# Patient Record
Sex: Female | Born: 1995 | Race: White | Hispanic: No | Marital: Married | Smoking: Never smoker
Health system: Southern US, Community
[De-identification: ages and names within clinical notes are randomized; demographics above are authoritative.]

## PROBLEM LIST (undated history)

## (undated) ENCOUNTER — Inpatient Hospital Stay (HOSPITAL_COMMUNITY): Payer: Self-pay

## (undated) DIAGNOSIS — E282 Polycystic ovarian syndrome: Secondary | ICD-10-CM

## (undated) DIAGNOSIS — F419 Anxiety disorder, unspecified: Secondary | ICD-10-CM

## (undated) DIAGNOSIS — N946 Dysmenorrhea, unspecified: Principal | ICD-10-CM

## (undated) DIAGNOSIS — Z789 Other specified health status: Secondary | ICD-10-CM

## (undated) DIAGNOSIS — O24419 Gestational diabetes mellitus in pregnancy, unspecified control: Secondary | ICD-10-CM

## (undated) HISTORY — DX: Anxiety disorder, unspecified: F41.9

## (undated) HISTORY — PX: WISDOM TOOTH EXTRACTION: SHX21

## (undated) HISTORY — DX: Polycystic ovarian syndrome: E28.2

## (undated) HISTORY — DX: Gestational diabetes mellitus in pregnancy, unspecified control: O24.419

## (undated) HISTORY — DX: Dysmenorrhea, unspecified: N94.6

---
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
## 2015-01-20 ENCOUNTER — Telehealth: Payer: Self-pay | Admitting: Obstetrics & Gynecology

## 2015-01-20 NOTE — Telephone Encounter (Signed)
Pt states she is taking her birth control pill as Dr. Elonda Husky prescribed but did miss taking 1 daily dose. Pt states she is at the point in the pack she should be having her period but has not started yet. Per Derrek Monaco, NP do home pregnancy test and continue birth control. Pt verbalized understanding.

## 2015-01-28 ENCOUNTER — Ambulatory Visit (INDEPENDENT_AMBULATORY_CARE_PROVIDER_SITE_OTHER): Payer: Medicaid Other | Admitting: Family Medicine

## 2015-01-28 ENCOUNTER — Encounter: Payer: Self-pay | Admitting: Family Medicine

## 2015-01-28 VITALS — BP 110/70 | Temp 98.5°F | Ht 66.0 in | Wt 179.5 lb

## 2015-01-28 DIAGNOSIS — J329 Chronic sinusitis, unspecified: Secondary | ICD-10-CM

## 2015-01-28 MED ORDER — AZITHROMYCIN 250 MG PO TABS: ORAL_TABLET | ORAL | Status: DC

## 2015-01-28 NOTE — Progress Notes (Signed)
   Subjective:    Patient ID: Paula Houston, female    DOB: 04-20-1996, 19 y.o.   MRN: [id number]  Sinusitis This is a new problem. The current episode started in the past 7 days. The problem is unchanged. There has been no fever. The pain is moderate. Associated symptoms include congestion, coughing, sneezing and a sore throat. (Nausea) Past treatments include oral decongestants. The treatment provided no relief.   Sore thraot and head ache  Sig headache  Mild achiness in legs  Throat mildly sore  Felt sick and vomited, also sig fdirrhea   Review of Systems  HENT: Positive for congestion, sneezing and sore throat.   Respiratory: Positive for cough.        Objective:   Physical Exam  Alert mild malaise HET moderate his congestion frontal tenderness. Erythematous neck supple lungs clear heart regular rate and rhythm      Assessment & Plan:  Impression acute rhinosinusitis plan antibiotics prescribed. Symptom care discussed warning signs discussed WSL

## 2015-02-03 ENCOUNTER — Telehealth: Payer: Self-pay | Admitting: Family Medicine

## 2015-02-03 ENCOUNTER — Other Ambulatory Visit: Payer: Self-pay | Admitting: *Deleted

## 2015-02-03 MED ORDER — BENZONATATE 100 MG PO CAPS: ORAL_CAPSULE | ORAL | Status: DC

## 2015-02-03 NOTE — Telephone Encounter (Signed)
Finished zpack still having chest and nasal congestion - clear- no fever, no wheezing. Would like something to take for cough. Coughing nonstop during the day. Would like something that she can take during the day

## 2015-02-03 NOTE — Telephone Encounter (Signed)
Pt states she was in an issued a zpak but she is not feeling  Better as far as the cough goes, can she get some thing in for  This cough? She has taken theraflu   [facility]

## 2015-02-03 NOTE — Telephone Encounter (Signed)
Med sent to pharm. Pt notified.  

## 2015-02-03 NOTE — Telephone Encounter (Signed)
Tess perle 100 numb 30 one q 6hrs prn

## 2015-02-17 ENCOUNTER — Telehealth: Payer: Self-pay | Admitting: Obstetrics & Gynecology

## 2015-02-17 NOTE — Telephone Encounter (Signed)
Pt states she had a period in April with the birth control pills Dr. Elonda Husky started her on at last appointment has not had period since. Pt states did Dr. Elonda Husky want to start her on the Clomid or not? Please advise.

## 2015-02-18 NOTE — Telephone Encounter (Signed)
Pt states been taking the Desogestrel ever since prescribed 12/10/2014, had a period in April but not one since.   Pt states does she need to start Clomid?

## 2015-02-18 NOTE — Telephone Encounter (Signed)
It was already e prescribed for her at the time of that visit, desogestrel OCP Should be available at the pharmacy

## 2015-02-19 ENCOUNTER — Telehealth: Payer: Self-pay | Admitting: *Deleted

## 2015-02-19 NOTE — Telephone Encounter (Signed)
Pt informed message routed to Dr. Elonda Husky yesterday, was out of office this am will be here pm will discuss with Dr. Elonda Husky and will contact pt.

## 2015-02-20 ENCOUNTER — Telehealth: Payer: Self-pay | Admitting: Obstetrics & Gynecology

## 2015-02-20 NOTE — Telephone Encounter (Signed)
Pt states she does want to become pregnant and would like to start clomid.

## 2015-02-24 ENCOUNTER — Telehealth: Payer: Self-pay | Admitting: Obstetrics & Gynecology

## 2015-02-24 MED ORDER — CLOMIPHENE CITRATE 50 MG PO TABS: 50.0000 mg | ORAL_TABLET | Freq: Every day | ORAL | Status: DC

## 2015-02-24 NOTE — Telephone Encounter (Signed)
Pt aware Rx for Clomid faxed to [place], [place].

## 2015-02-26 ENCOUNTER — Telehealth: Payer: Self-pay | Admitting: *Deleted

## 2015-02-26 NOTE — Telephone Encounter (Signed)
Pt states when would she pick up the refill for the Clomid? Pt informed she is to take the clomid on day 1 or 3 of her period for each month. Pt verbalized understanding.

## 2015-03-24 ENCOUNTER — Telehealth: Payer: Self-pay | Admitting: *Deleted

## 2015-03-24 NOTE — Telephone Encounter (Signed)
Pt states she started her period Saturday when should she start taking Clomid. Pt informed day 1or 3 of her period. Pt verbalized understanding.

## 2015-06-02 ENCOUNTER — Telehealth: Payer: Self-pay | Admitting: *Deleted

## 2015-06-02 NOTE — Telephone Encounter (Signed)
Pt states was started on OCP to regulate her period, and began clomid. Pt states her last period started on 08/11-08/15/16, started clomid on the 11 th, now she is having some light spotting. Is this normal? Please advise.

## 2015-06-02 NOTE — Telephone Encounter (Signed)
Pt informed per Dr.Eure "not unusual" to have vaginal spotting. Pt to continue OCP and Clomid as prescribed. Pt verbalized understanding.

## 2015-06-02 NOTE — Telephone Encounter (Signed)
Not a problem,, not unusual

## 2015-08-04 ENCOUNTER — Telehealth: Payer: Self-pay | Admitting: *Deleted

## 2015-08-04 NOTE — Telephone Encounter (Signed)
Pt states needs her name updated in EP[employer] due to name change on her insurance card. Call transferred to front staff.

## 2015-10-15 ENCOUNTER — Telehealth: Payer: Self-pay | Admitting: *Deleted

## 2015-10-15 NOTE — Telephone Encounter (Signed)
Pt states has 3-4 of the clomid left. What does she need to do now?  Contact pt at [phone].

## 2015-10-16 ENCOUNTER — Telehealth: Payer: Self-pay | Admitting: *Deleted

## 2015-10-20 NOTE — Telephone Encounter (Signed)
I called pt and answered her questions

## 2015-10-20 NOTE — Telephone Encounter (Signed)
ditto

## 2015-12-08 ENCOUNTER — Encounter: Payer: Self-pay | Admitting: Family Medicine

## 2015-12-08 ENCOUNTER — Ambulatory Visit (INDEPENDENT_AMBULATORY_CARE_PROVIDER_SITE_OTHER): Payer: Medicaid Other | Admitting: Family Medicine

## 2015-12-08 VITALS — BP 122/84 | Temp 98.4°F | Ht 66.0 in | Wt 184.4 lb

## 2015-12-08 DIAGNOSIS — R21 Rash and other nonspecific skin eruption: Secondary | ICD-10-CM | POA: Diagnosis not present

## 2015-12-08 DIAGNOSIS — A084 Viral intestinal infection, unspecified: Secondary | ICD-10-CM | POA: Diagnosis not present

## 2015-12-08 MED ORDER — ONDANSETRON 4 MG PO TBDP: 4.0000 mg | ORAL_TABLET | Freq: Three times a day (TID) | ORAL | Status: DC | PRN

## 2015-12-08 MED ORDER — KETOCONAZOLE 2 % EX CREA: 1.0000 "application " | TOPICAL_CREAM | Freq: Two times a day (BID) | CUTANEOUS | Status: DC

## 2015-12-08 NOTE — Progress Notes (Signed)
   Subjective:    Patient ID: Paula Houston, female    DOB: [birth year], 20 y.o.   MRN: [id number]  Abdominal Pain This is a new problem. The current episode started yesterday. The pain is located in the generalized abdominal region. Associated symptoms include diarrhea, nausea and vomiting. Associated symptoms comments: Cough. Nothing aggravates the pain.   Patient has c/o of discoloration to neck.  Hit fairly suddenly last night  Pos abd pains and crampts  Felt very nauseated, took a gatorade, and then vomited multi times at least ten  Stools loose but no diarrhea  Felt chilled and aoff an on   Review of Systems  Gastrointestinal: Positive for nausea, vomiting, abdominal pain and diarrhea.       Objective:   Physical Exam Alert mild malaise. Vital stable HEENT normal. Lungs clear heart rhythm abdomen hyperactive bowel sounds diffuse upper mid abdominal tenderness mild       Assessment & Plan:  Impression acute viral gastroenteritis others in the family with plan Zofran when necessary running signs and diet discussed WSL also ketoconazole cream written for tenia versicolor rash

## 2015-12-22 ENCOUNTER — Ambulatory Visit (INDEPENDENT_AMBULATORY_CARE_PROVIDER_SITE_OTHER): Payer: Medicaid Other | Admitting: Nurse Practitioner

## 2015-12-22 ENCOUNTER — Encounter: Payer: Self-pay | Admitting: Nurse Practitioner

## 2015-12-22 VITALS — BP 100/70 | Temp 98.6°F | Ht 66.0 in | Wt 187.2 lb

## 2015-12-22 DIAGNOSIS — N760 Acute vaginitis: Secondary | ICD-10-CM

## 2015-12-22 DIAGNOSIS — N912 Amenorrhea, unspecified: Secondary | ICD-10-CM

## 2015-12-22 LAB — POCT URINE PREGNANCY: Preg Test, Ur: NEGATIVE

## 2015-12-22 MED ORDER — FLUCONAZOLE 150 MG PO TABS: ORAL_TABLET | ORAL | Status: DC

## 2015-12-22 MED ORDER — METRONIDAZOLE 0.75 % VA GEL: 1.0000 | Freq: Every day | VAGINAL | Status: DC

## 2015-12-22 NOTE — Patient Instructions (Signed)
Vaginal probiotic: Pro [employer] Acidophilus

## 2015-12-25 ENCOUNTER — Ambulatory Visit (INDEPENDENT_AMBULATORY_CARE_PROVIDER_SITE_OTHER): Payer: Medicaid Other | Admitting: Family Medicine

## 2015-12-25 ENCOUNTER — Encounter: Payer: Self-pay | Admitting: Family Medicine

## 2015-12-25 VITALS — Temp 99.1°F | Wt 186.0 lb

## 2015-12-25 DIAGNOSIS — J310 Chronic rhinitis: Secondary | ICD-10-CM

## 2015-12-25 DIAGNOSIS — J329 Chronic sinusitis, unspecified: Secondary | ICD-10-CM | POA: Diagnosis not present

## 2015-12-25 MED ORDER — AZITHROMYCIN 250 MG PO TABS: ORAL_TABLET | ORAL | Status: DC

## 2015-12-25 NOTE — Progress Notes (Signed)
   Subjective:    Patient ID: Paula Houston, female    DOB: 01-31-1996, 20 y.o.   MRN: [id number]  Cough This is a new problem. Episode onset: 2 days ago. Associated symptoms include nasal congestion and a sore throat. Treatments tried: allegra.    tue sneezing and coughing  Dim energy   Sore throat and dranag  Throat sore, frontal sinus and cong estion   No high fevers no muscle aches or noint aches   e Review of Systems  HENT: Positive for sore throat.   Respiratory: Positive for cough.        Objective:   Physical Exam   Alert, mild malaise. Hydration good Vitals stable. frontal/ maxillary tenderness evident positive nasal congestion. pharynx normal neck supple  lungs clear/no crackles or wheezes. heart regular in rhythm      Assessment & Plan:  Impression rhinosinusitis likely post viral, discussed with patient. plan antibiotics prescribed. Questions answered. Symptomatic care discussed. warning signs discussed. WSL Seen after-hours rather than since emergency room

## 2015-12-26 ENCOUNTER — Encounter: Payer: Self-pay | Admitting: Nurse Practitioner

## 2015-12-26 ENCOUNTER — Ambulatory Visit: Payer: Medicaid Other | Admitting: Obstetrics & Gynecology

## 2015-12-26 LAB — POCT WET PREP WITH KOH
KOH Prep POC: NEGATIVE
RBC Wet Prep HPF POC: NEGATIVE
TRICHOMONAS UA: NEGATIVE

## 2015-12-26 NOTE — Progress Notes (Signed)
Subjective:  Presents for complaints of off-and-on bilateral breast pain over the past year. No discharge from the breast. No unusual headaches. Occasional nausea after eating. No acid reflux or heartburn symptoms. No abdominal pain. Has not had a menstrual cycle in about 2 years. Went on Clomid for a while, having regular cycles. Being followed by gynecology. Plans to have another pregnancy. Also complaints of slight discharge pain with intercourse. No fever. No pelvic pain. This is been going on for several months. Married, same sexual partner. No urinary symptoms.  Objective:   BP 100/70 mmHg  Temp(Src) 98.6 F (37 C) (Oral)  Ht 5\' 6"  (1.676 m)  Wt 187 lb 4 oz (84.936 kg)  BMI 30.24 kg/m2 NAD. Alert, oriented. Lungs clear. Heart regular rate rhythm. Abdomen soft nondistended nontender. Breast exam: Dense tissue with minimal fine nodularity, no dominant masses. Axilla no adenopathy. External GU: Minimal erythema, no rashes or lesions. Vagina mild erythema, large amount of thick slightly yellow clumpy discharge. Cervix normal limit in appearance. No CMT. Uterus and adnexa normal limit and nontender. No obvious masses. Results for orders placed or performed in visit on 12/22/15  POCT urine pregnancy  Result Value Ref Range   Preg Test, Ur Negative Negative  POCT Wet Prep with KOH  Result Value Ref Range   Trichomonas, UA Negative    Clue Cells Wet Prep HPF POC mod    Epithelial Wet Prep HPF POC Moderate Few, Moderate, Many, Too numerous to count   Yeast Wet Prep HPF POC few    Bacteria Wet Prep HPF POC Many (A) None, Few, Too numerous to count   RBC Wet Prep HPF POC neg    WBC Wet Prep HPF POC mod    KOH Prep POC Negative      Assessment: Amenorrhea - Plan: POCT urine pregnancy  Vaginitis and vulvovaginitis  Plan:  Meds ordered this encounter  Medications  . DISCONTD: fluconazole (DIFLUCAN) 150 MG tablet    Sig: One po qd prn yeast infection; may repeat in 3-4 days if needed   Dispense:  2 tablet    Refill:  0    Order Specific Question:  Supervising Provider    Answer:  Mikey Kirschner [[id number]]  . DISCONTD: metroNIDAZOLE (METROGEL VAGINAL) 0.75 % vaginal gel    Sig: Place 1 Applicatorful vaginally at bedtime. X 5 d    Dispense:  70 g    Refill:  0    Order Specific Question:  Supervising Provider    Answer:  Mikey Kirschner [[id number]]   Breast pain and nausea most likely related to fluctuations in her hormones. Recheck with gynecology. Treatment given for vaginitis, call back or check with gynecology if symptoms persist.

## 2015-12-27 ENCOUNTER — Encounter: Payer: Self-pay | Admitting: Family Medicine

## 2015-12-31 ENCOUNTER — Ambulatory Visit (INDEPENDENT_AMBULATORY_CARE_PROVIDER_SITE_OTHER): Payer: Medicaid Other | Admitting: Obstetrics & Gynecology

## 2015-12-31 ENCOUNTER — Encounter: Payer: Self-pay | Admitting: Obstetrics & Gynecology

## 2015-12-31 VITALS — BP 102/70 | HR 76 | Ht 66.0 in | Wt 135.0 lb

## 2015-12-31 DIAGNOSIS — R252 Cramp and spasm: Secondary | ICD-10-CM | POA: Diagnosis not present

## 2015-12-31 DIAGNOSIS — R110 Nausea: Secondary | ICD-10-CM | POA: Diagnosis not present

## 2016-02-22 NOTE — Progress Notes (Signed)
Patient ID: Paula Houston, female   DOB: January 29, 1996, 20 y.o.   MRN: [id number]      Chief Complaint  Patient presents with  . Follow-up    c/c Clomid made her sick/ cramping and feeling bad.    Blood pressure 102/70, pulse 76, height 5\' 6"  (1.676 m), weight 135 lb (61.236 kg), last menstrual period 12/01/2015.  20 y.o. G1P0001 Patient's last menstrual period was 12/01/2015. The current method of family planning is none.  Subjective Patient states she's having nausea and Clomid The Clomid is causing her to have regular periods Patient is for mental have any other solution for ovulation induction besides the Clomid  Objective   Pertinent ROS   Labs or studies     Impression Diagnoses this Encounter::   ICD-9-CM ICD-10-CM   1. Nausea 787.02 R11.0     Established relevant diagnosis(es):   Plan/Recommendations: No orders of the defined types were placed in this encounter.    Labs or Scans Ordered: No orders of the defined types were placed in this encounter.    Management::   Follow up No Follow-up on file.     10   Face to face time:  10 minutes  Greater than 50% of the visit time was spent in counseling and coordination of care with the patient.  The summary and outline of the counseling and care coordination is summarized in the note above.   All questions were answered.

## 2016-03-18 ENCOUNTER — Encounter: Payer: Self-pay | Admitting: Family Medicine

## 2016-03-18 ENCOUNTER — Ambulatory Visit (INDEPENDENT_AMBULATORY_CARE_PROVIDER_SITE_OTHER): Payer: Medicaid Other | Admitting: Family Medicine

## 2016-03-18 VITALS — BP 116/84 | Temp 98.9°F | Ht 66.0 in | Wt 188.5 lb

## 2016-03-18 DIAGNOSIS — N3000 Acute cystitis without hematuria: Secondary | ICD-10-CM

## 2016-03-18 DIAGNOSIS — R309 Painful micturition, unspecified: Secondary | ICD-10-CM

## 2016-03-18 LAB — POCT URINALYSIS DIPSTICK
PH UA: 5
Spec Grav, UA: 1.025

## 2016-03-18 MED ORDER — PHENAZOPYRIDINE HCL 200 MG PO TABS: 200.0000 mg | ORAL_TABLET | Freq: Three times a day (TID) | ORAL | Status: DC | PRN

## 2016-03-18 MED ORDER — CEFPROZIL 500 MG PO TABS: 500.0000 mg | ORAL_TABLET | Freq: Two times a day (BID) | ORAL | Status: DC

## 2016-03-18 NOTE — Progress Notes (Signed)
   Subjective:    Patient ID: Paula Houston, female    DOB: [birth year], 20 y.o.   MRN: [id number]  Urinary Tract Infection  This is a new problem. The current episode started yesterday. Associated symptoms comments: Painful urination. She has tried nothing for the symptoms.   Results for orders placed or performed in visit on 03/18/16  POCT urinalysis dipstick  Result Value Ref Range   Color, UA Dark Yellow    Clarity, UA Clear    Glucose, UA     Bilirubin, UA     Ketones, UA     Spec Grav, UA 1.025    Blood, UA Large    pH, UA 5.0    Protein, UA     Urobilinogen, UA     Nitrite, UA     Leukocytes, UA moderate (2+) (A) Negative   States no other concerns this visit. Patient not feeling well but denies high fever chills sweats relates a little bit of dysuria urinary frequency and foul odor to the ER  Review of Systems See above.    Objective:   Physical Exam  Flanks nontender abdomen soft lungs clear heart regular HEENT benign      Assessment & Plan:  Urinary tract infection antibiotics pretty M prescribed warning signs discussed follow-up if ongoing troubles no sign of a kidney infection currently

## 2016-06-01 ENCOUNTER — Telehealth: Payer: Self-pay | Admitting: *Deleted

## 2016-06-01 NOTE — Telephone Encounter (Signed)
Pt states she is 2 month pregnancy and wants to know if it is safe for her to go to [employer] [place]? Per Knute Neu, CNM can wade in water but does not recommend pt going down the slide due to impact. Pt verbalized understanding.

## 2016-06-08 ENCOUNTER — Ambulatory Visit (INDEPENDENT_AMBULATORY_CARE_PROVIDER_SITE_OTHER): Payer: Medicaid Other | Admitting: Women's Health

## 2016-06-08 ENCOUNTER — Encounter: Payer: Self-pay | Admitting: Women's Health

## 2016-06-08 VITALS — BP 124/86 | HR 92 | Wt 182.0 lb

## 2016-06-08 DIAGNOSIS — Z369 Encounter for antenatal screening, unspecified: Secondary | ICD-10-CM

## 2016-06-08 DIAGNOSIS — Z1389 Encounter for screening for other disorder: Secondary | ICD-10-CM

## 2016-06-08 DIAGNOSIS — Z331 Pregnant state, incidental: Secondary | ICD-10-CM

## 2016-06-08 DIAGNOSIS — Z3481 Encounter for supervision of other normal pregnancy, first trimester: Secondary | ICD-10-CM | POA: Diagnosis not present

## 2016-06-08 DIAGNOSIS — Z3A12 12 weeks gestation of pregnancy: Secondary | ICD-10-CM | POA: Diagnosis not present

## 2016-06-08 DIAGNOSIS — E282 Polycystic ovarian syndrome: Secondary | ICD-10-CM

## 2016-06-08 DIAGNOSIS — Z3491 Encounter for supervision of normal pregnancy, unspecified, first trimester: Secondary | ICD-10-CM

## 2016-06-08 DIAGNOSIS — O210 Mild hyperemesis gravidarum: Secondary | ICD-10-CM

## 2016-06-08 DIAGNOSIS — Z3490 Encounter for supervision of normal pregnancy, unspecified, unspecified trimester: Secondary | ICD-10-CM | POA: Insufficient documentation

## 2016-06-08 DIAGNOSIS — N979 Female infertility, unspecified: Secondary | ICD-10-CM

## 2016-06-08 DIAGNOSIS — Z0283 Encounter for blood-alcohol and blood-drug test: Secondary | ICD-10-CM

## 2016-06-08 NOTE — Progress Notes (Signed)
  Subjective:  Paula Houston is a 20 y.o. G46P1001 Caucasian female at [redacted]w[redacted]d by 6wk u/s, being seen today for her first obstetrical visit.  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; term uncomplicated svb x 1 at 0000000- denies problems w/ infertility at that time.  Pregnancy history fully reviewed.  Patient reports some am vomiting- not much nausea- declines meds at this time. Denies vb, cramping, uti s/s, abnormal/malodorous vag d/c, or vulvovaginal itching/irritation.  BP 124/86   Pulse 92   Wt 182 lb (82.6 kg)   LMP 12/01/2015   BMI 29.38 kg/m   HISTORY: OB History  Gravida Para Term Preterm AB Living  2 1 1  0 0 1  SAB TAB Ectopic Multiple Live Births  0 0 0 0 1    # Outcome Date GA Lbr Len/2nd Weight Sex Delivery Anes PTL Lv  2 Current           1 Term 12/29/10 [redacted]w[redacted]d  5 lb 14 oz (2.665 kg) M Vag-Spont EPI N LIV     Birth Comments: System Generated. Please review and update pregnancy details.     Past Medical History:  Diagnosis Date  . Medical history non-contributory   . Painful menstrual periods 06/04/2014  . PCOS (polycystic ovarian syndrome)    Past Surgical History:  Procedure Laterality Date  . NO PAST SURGERIES     Family History  Problem Relation Age of Onset  . COPD Paternal Grandmother   . Diabetes Mother   . Diabetes Brother     Exam   System:     General: Well developed & nourished, no acute distress   Skin: Warm & dry, normal coloration and turgor, no rashes   Neurologic: Alert & oriented, normal mood   Cardiovascular: Regular rate & rhythm   Respiratory: Effort & rate normal, LCTAB, acyanotic   Abdomen: Soft, non tender   Extremities: normal strength, tone  Thin prep pap smear <21yo FHR: 170 via doppler   Assessment:   Pregnancy: G2P1001 Patient Active  Problem List   Diagnosis Date Noted  . Supervision of normal pregnancy 06/08/2016    Priority: High  . PCOS (polycystic ovarian syndrome) 06/08/2016  . Infertility, female 06/08/2016  . Painful menstrual periods 06/04/2014    [redacted]w[redacted]d G2P1001 New OB visit Secondary infertility- now pregnant! PCOS Vomiting of pregnancy  Plan:  Initial labs drawn Continue prenatal vitamins Problem list reviewed and updated Reviewed n/v relief measures and warning s/s to report Reviewed recommended weight gain based on pre-gravid BMI Encouraged well-balanced diet Genetic Screening discussed Integrated Screen: declined Cystic fibrosis screening discussed declined Ultrasound discussed; fetal survey: requested Follow up in 4 weeks for visit [place] completed  Tawnya Crook CNM, [facility] 06/08/2016 4:48 PM

## 2016-06-08 NOTE — Patient Instructions (Signed)

## 2016-06-09 LAB — CBC
Hematocrit: 38.1 % (ref 34.0–46.6)
Hemoglobin: 13 g/dL (ref 11.1–15.9)
MCH: 30.2 pg (ref 26.6–33.0)
MCHC: 34.1 g/dL (ref 31.5–35.7)
MCV: 89 fL (ref 79–97)
PLATELETS: 513 10*3/uL — AB (ref 150–379)
RBC: 4.3 x10E6/uL (ref 3.77–5.28)
RDW: 13.9 % (ref 12.3–15.4)
WBC: 13.1 10*3/uL — ABNORMAL HIGH (ref 3.4–10.8)

## 2016-06-09 LAB — URINALYSIS, ROUTINE W REFLEX MICROSCOPIC
Bilirubin, UA: NEGATIVE
GLUCOSE, UA: NEGATIVE
KETONES UA: NEGATIVE
Leukocytes, UA: NEGATIVE
NITRITE UA: NEGATIVE
PROTEIN UA: NEGATIVE
RBC, UA: NEGATIVE
SPEC GRAV UA: 1.022 (ref 1.005–1.030)
Urobilinogen, Ur: 1 mg/dL (ref 0.2–1.0)
pH, UA: 7 (ref 5.0–7.5)

## 2016-06-09 LAB — HIV ANTIBODY (ROUTINE TESTING W REFLEX): HIV Screen 4th Generation wRfx: NONREACTIVE

## 2016-06-09 LAB — PMP SCREEN PROFILE (10S), URINE
AMPHETAMINE SCRN UR: NEGATIVE ng/mL
Barbiturate Screen, Ur: NEGATIVE ng/mL
Benzodiazepine Screen, Urine: NEGATIVE ng/mL
CREATININE(CRT), U: 134.1 mg/dL (ref 20.0–300.0)
Cannabinoids Ur Ql Scn: NEGATIVE ng/mL
Cocaine(Metab.)Screen, Urine: NEGATIVE ng/mL
METHADONE SCREEN, URINE: NEGATIVE ng/mL
Opiate Scrn, Ur: NEGATIVE ng/mL
Oxycodone+Oxymorphone Ur Ql Scn: NEGATIVE ng/mL
PCP SCRN UR: NEGATIVE ng/mL
PROPOXYPHENE SCREEN: NEGATIVE ng/mL
Ph of Urine: 6.4 (ref 4.5–8.9)

## 2016-06-09 LAB — ABO/RH: Rh Factor: POSITIVE

## 2016-06-09 LAB — HEPATITIS B SURFACE ANTIGEN: Hepatitis B Surface Ag: NEGATIVE

## 2016-06-09 LAB — ANTIBODY SCREEN: Antibody Screen: NEGATIVE

## 2016-06-09 LAB — VARICELLA ZOSTER ANTIBODY, IGG: Varicella zoster IgG: 1345 index (ref >165)

## 2016-06-09 LAB — RUBELLA SCREEN: Rubella Antibodies, IGG: 1.91 index (ref >0.99)

## 2016-06-09 LAB — RPR: RPR Ser Ql: NONREACTIVE

## 2016-06-10 LAB — GC/CHLAMYDIA PROBE AMP
Chlamydia trachomatis, NAA: NEGATIVE
NEISSERIA GONORRHOEAE BY PCR: NEGATIVE

## 2016-06-10 LAB — URINE CULTURE

## 2016-06-17 ENCOUNTER — Telehealth: Payer: Self-pay | Admitting: Advanced Practice Midwife

## 2016-06-17 MED ORDER — DOXYLAMINE-PYRIDOXINE 10-10 MG PO TBEC: 10.0000 mg | DELAYED_RELEASE_TABLET | ORAL | 0 refills | Status: DC

## 2016-06-17 NOTE — Telephone Encounter (Signed)
Pt c/o nausea. Samples of Diclegis left at front desk for pick up.

## 2016-07-06 ENCOUNTER — Ambulatory Visit (INDEPENDENT_AMBULATORY_CARE_PROVIDER_SITE_OTHER): Payer: Self-pay | Admitting: Obstetrics & Gynecology

## 2016-07-06 VITALS — BP 110/52 | HR 78 | Wt 179.0 lb

## 2016-07-06 DIAGNOSIS — Z3492 Encounter for supervision of normal pregnancy, unspecified, second trimester: Secondary | ICD-10-CM

## 2016-07-06 NOTE — Progress Notes (Signed)
G2P1001 [redacted]w[redacted]d Estimated Date of Delivery: 12/26/16  Blood pressure (!) 110/52, pulse 78, weight 179 lb (81.2 kg), last menstrual period 12/01/2015.   BP weight and urine results all reviewed and noted.  Please refer to the obstetrical flow sheet for the fundal height and fetal heart rate documentation:  Patient reports good fetal movement, denies any bleeding and no rupture of membranes symptoms or regular contractions. Patient is without complaints. All questions were answered.  Orders Placed This Encounter  Procedures  . US OB Comp + 14 Wk    Plan:  Continued routine obstetrical care,   Return in about 4 weeks (around 08/03/2016) for 20 week sono, LROB.

## 2016-07-26 ENCOUNTER — Inpatient Hospital Stay (HOSPITAL_COMMUNITY)
Admission: AD | Admit: 2016-07-26 | Discharge: 2016-07-26 | Disposition: A | Discharge status: Home / Self Care | Payer: Medicaid Other | Source: Ambulatory Visit | Attending: Obstetrics and Gynecology | Admitting: Obstetrics and Gynecology

## 2016-07-26 ENCOUNTER — Encounter (HOSPITAL_COMMUNITY): Payer: Self-pay | Admitting: *Deleted

## 2016-07-26 DIAGNOSIS — Z3A18 18 weeks gestation of pregnancy: Secondary | ICD-10-CM | POA: Insufficient documentation

## 2016-07-26 DIAGNOSIS — N3000 Acute cystitis without hematuria: Secondary | ICD-10-CM

## 2016-07-26 DIAGNOSIS — O2342 Unspecified infection of urinary tract in pregnancy, second trimester: Secondary | ICD-10-CM | POA: Insufficient documentation

## 2016-07-26 DIAGNOSIS — R300 Dysuria: Secondary | ICD-10-CM | POA: Diagnosis present

## 2016-07-26 LAB — URINE MICROSCOPIC-ADD ON

## 2016-07-26 LAB — URINALYSIS, ROUTINE W REFLEX MICROSCOPIC
BILIRUBIN URINE: NEGATIVE
GLUCOSE, UA: NEGATIVE mg/dL
KETONES UR: NEGATIVE mg/dL
NITRITE: POSITIVE — AB
Protein, ur: NEGATIVE mg/dL
Specific Gravity, Urine: <1.005 — ABNORMAL LOW (ref 1.005–1.030)
pH: 6 (ref 5.0–8.0)

## 2016-07-26 MED ORDER — CEPHALEXIN 500 MG PO CAPS: 500.0000 mg | ORAL_CAPSULE | Freq: Three times a day (TID) | ORAL | 0 refills | Status: DC

## 2016-07-26 NOTE — Discharge Instructions (Signed)
Pregnancy and Urinary Tract Infection °A urinary tract infection (UTI) is a bacterial infection of the urinary tract. Infection of the urinary tract can include the ureters, kidneys (pyelonephritis), bladder (cystitis), and urethra (urethritis). All pregnant women should be screened for bacteria in the urinary tract. Identifying and treating a UTI will decrease the risk of preterm labor and developing more serious infections in both the mother and baby. °CAUSES °Bacteria germs cause almost all UTIs.  °RISK FACTORS °Many factors can increase your chances of getting a UTI during pregnancy. These include: °· Having a short urethra. °· Poor toilet and hygiene habits. °· Sexual intercourse. °· Blockage of urine along the urinary tract. °· Problems with the pelvic muscles or nerves. °· Diabetes. °· Obesity. °· Bladder problems after having several children. °· Previous history of UTI. °SIGNS AND SYMPTOMS  °· Pain, burning, or a stinging feeling when urinating. °· Suddenly feeling the need to urinate right away (urgency). °· Loss of bladder control (urinary incontinence). °· Frequent urination, more than is common with pregnancy. °· Lower abdominal or back discomfort. °· Cloudy urine. °· Blood in the urine (hematuria). °· Fever.  °When the kidneys are infected, the symptoms may be: °· Back pain. °· Flank pain on the right side more so than the left. °· Fever. °· Chills. °· Nausea. °· Vomiting. °DIAGNOSIS  °A urinary tract infection is usually diagnosed through urine tests. Additional tests and procedures are sometimes done. These may include: °· Ultrasound exam of the kidneys, ureters, bladder, and urethra. °· Looking in the bladder with a lighted tube (cystoscopy). °TREATMENT °Typically, UTIs can be treated with antibiotic medicines.  °HOME CARE INSTRUCTIONS  °· Only take over-the-counter or prescription medicines as directed by your health care provider. If you were prescribed antibiotics, take them as directed. Finish  them even if you start to feel better. °· Drink enough fluids to keep your urine clear or pale yellow. °· Do not have sexual intercourse until the infection is gone and your health care provider says it is okay. °· Make sure you are tested for UTIs throughout your pregnancy. These infections often come back.  °Preventing a UTI in the Future °· Practice good toilet habits. Always wipe from front to back. Use the tissue only once. °· Do not hold your urine. Empty your bladder as soon as possible when the urge comes. °· Do not douche or use deodorant sprays. °· Wash with soap and warm water around the genital area and the anus. °· Empty your bladder before and after sexual intercourse. °· Wear underwear with a cotton crotch. °· Avoid caffeine and carbonated drinks. They can irritate the bladder. °· Drink cranberry juice or take cranberry pills. This may decrease the risk of getting a UTI. °· Do not drink alcohol. °· Keep all your appointments and tests as scheduled.  °SEEK MEDICAL CARE IF:  °· Your symptoms get worse. °· You are still having fevers 2 or more days after treatment begins. °· You have a rash. °· You feel that you are having problems with medicines prescribed. °· You have abnormal vaginal discharge. °SEEK IMMEDIATE MEDICAL CARE IF:  °· You have back or flank pain. °· You have chills. °· You have blood in your urine. °· You have nausea and vomiting. °· You have contractions of your uterus. °· You have a gush of fluid from the vagina. °MAKE SURE YOU: °· Understand these instructions.   °· Will watch your condition.   °· Will get help right away if you are not doing   pain.  · You have chills.  · You have blood in your urine.  · You have nausea and vomiting.  · You have contractions of your uterus.  · You have a gush of fluid from the vagina.  MAKE SURE YOU:  · Understand these instructions.    · Will watch your condition.    · Will get help right away if you are not doing well or get worse.       This information is not intended to replace advice given to you by your health care provider. Make sure you discuss any questions you have with your health care provider.     Document Released: 01/22/2011 Document Revised: 07/18/2013 Document Reviewed: 04/26/2013  Elsevier Interactive Patient Education ©2016 [employer]  Inc[employer]

## 2016-07-26 NOTE — MAU Note (Signed)
Pt reports she began having pain with urination this pm and now is having lower abd pain . States she has a history of kidney infections in her previous preg.

## 2016-07-26 NOTE — MAU Provider Note (Signed)
History   [id number]   Chief Complaint  Patient presents with  . Dysuria    HPI Paula Houston is a 20 y.o. female  G2P1001 at [redacted]w[redacted]d IUP here with report of dysuria that started last night.  Denies fever body aches or chills.  Also cramping in lower pelvis with urination.  History of kidney infections.  This is first UTI of this pregnancy.  No report of flank pain.    Patient's last menstrual period was 12/01/2015.  OB History  Gravida Para Term Preterm AB Living  2 1 1  0 0 1  SAB TAB Ectopic Multiple Live Births  0 0 0 0 1    # Outcome Date GA Lbr Len/2nd Weight Sex Delivery Anes PTL Lv  2 Current           1 Term 12/29/10 [redacted]w[redacted]d  5 lb 14 oz (2.665 kg) M Vag-Spont EPI N LIV     Birth Comments: System Generated. Please review and update pregnancy details.      Past Medical History:  Diagnosis Date  . Medical history non-contributory   . Painful menstrual periods 06/04/2014  . PCOS (polycystic ovarian syndrome)     Family History  Problem Relation Age of Onset  . COPD Paternal Grandmother   . Diabetes Mother   . Diabetes Brother     Social History   Social History  . Marital status: Married    Spouse name: N/A  . Number of children: N/A  . Years of education: N/A   Social History Main Topics  . Smoking status: Never Smoker  . Smokeless tobacco: Never Used  . Alcohol use No  . Drug use: No  . Sexual activity: Yes    Birth control/ protection: None   Other Topics Concern  . None   Social History Narrative  . None    Allergies  Allergen Reactions  . Codeine Nausea And Vomiting    No current facility-administered medications on file prior to encounter.    Current Outpatient Prescriptions on File Prior to Encounter  Medication Sig Dispense Refill  . Prenatal Vit-Fe Fumarate-FA (MULTIVITAMIN-PRENATAL) 27-0.8 MG TABS tablet Take 1 tablet by mouth daily at 12 noon.       Review of Systems  Constitutional: Negative for chills and fever.   Genitourinary: Positive for dysuria and pelvic pain. Negative for flank pain, hematuria, urgency and vaginal bleeding.  All other systems reviewed and are negative.    Physical Exam   Vitals:   07/26/16 0506  BP: 116/60  Pulse: 91  Resp: 16  Temp: 98.2 F (36.8 C)  TempSrc: Oral  SpO2: 98%  Weight: 182 lb (82.6 kg)  Height: 5\' 6"  (1.676 m)    Physical Exam  Constitutional: She is oriented to person, place, and time. She appears well-developed and well-nourished.  HENT:  Head: Normocephalic.  Neck: Normal range of motion. Neck supple.  Cardiovascular: Normal rate, regular rhythm and normal heart sounds.   Respiratory: Effort normal and breath sounds normal. No respiratory distress.  GI: Soft. There is no tenderness.  Genitourinary: No bleeding in the vagina.  Musculoskeletal: Normal range of motion. She exhibits no edema.  Neurological: She is alert and oriented to person, place, and time.  Skin: Skin is warm and dry.   FHR 145 MAU Course  Procedures Results for orders placed or performed during the hospital encounter of 07/26/16 (from the past 24 hour(s))  Urinalysis, Routine w reflex microscopic (not at [facility])  Status: Abnormal   Collection Time: 07/26/16  4:55 AM  Result Value Ref Range   Color, Urine YELLOW YELLOW   APPearance CLEAR CLEAR   Specific Gravity, Urine <1.005 (L) 1.005 - 1.030   pH 6.0 5.0 - 8.0   Glucose, UA NEGATIVE NEGATIVE mg/dL   Hgb urine dipstick MODERATE (A) NEGATIVE   Bilirubin Urine NEGATIVE NEGATIVE   Ketones, ur NEGATIVE NEGATIVE mg/dL   Protein, ur NEGATIVE NEGATIVE mg/dL   Nitrite POSITIVE (A) NEGATIVE   Leukocytes, UA MODERATE (A) NEGATIVE  Urine microscopic-add on     Status: Abnormal   Collection Time: 07/26/16  4:55 AM  Result Value Ref Range   Squamous Epithelial / LPF 0-5 (A) NONE SEEN   WBC, UA 6-30 0 - 5 WBC/hpf   RBC / HPF 6-30 0 - 5 RBC/hpf   Bacteria, UA MANY (A) NONE SEEN     Assessment and Plan  20 y.o.  G2P1001 at [redacted]w[redacted]d IUP  UTI in Pregnancy  Plan: Discharge home RX Keflex 500 mg TID x 7 days Urine culture sent Follow-up if no improvement or worsening of symptoms   Paula Houston, CNM 07/26/2016 5:52 AM

## 2016-07-28 LAB — URINE CULTURE: Culture: >=100000 — AB

## 2016-08-02 ENCOUNTER — Other Ambulatory Visit: Payer: Self-pay | Admitting: Obstetrics & Gynecology

## 2016-08-02 DIAGNOSIS — Z363 Encounter for antenatal screening for malformations: Secondary | ICD-10-CM

## 2016-08-03 ENCOUNTER — Ambulatory Visit (INDEPENDENT_AMBULATORY_CARE_PROVIDER_SITE_OTHER): Payer: Medicaid Other | Admitting: Advanced Practice Midwife

## 2016-08-03 ENCOUNTER — Encounter: Payer: Self-pay | Admitting: Advanced Practice Midwife

## 2016-08-03 ENCOUNTER — Ambulatory Visit (INDEPENDENT_AMBULATORY_CARE_PROVIDER_SITE_OTHER): Payer: Medicaid Other

## 2016-08-03 VITALS — BP 104/56 | HR 80 | Wt 183.0 lb

## 2016-08-03 DIAGNOSIS — Z3482 Encounter for supervision of other normal pregnancy, second trimester: Secondary | ICD-10-CM

## 2016-08-03 DIAGNOSIS — Z3A20 20 weeks gestation of pregnancy: Secondary | ICD-10-CM

## 2016-08-03 DIAGNOSIS — Z363 Encounter for antenatal screening for malformations: Secondary | ICD-10-CM | POA: Diagnosis not present

## 2016-08-03 DIAGNOSIS — Z1389 Encounter for screening for other disorder: Secondary | ICD-10-CM

## 2016-08-03 DIAGNOSIS — Z331 Pregnant state, incidental: Secondary | ICD-10-CM

## 2016-08-03 LAB — POCT URINALYSIS DIPSTICK
Blood, UA: NEGATIVE
Glucose, UA: NEGATIVE
KETONES UA: NEGATIVE
LEUKOCYTES UA: NEGATIVE
Nitrite, UA: NEGATIVE
Protein, UA: NEGATIVE

## 2016-08-03 NOTE — Progress Notes (Signed)
[place] [id number] wks,cephalic,post pl gr 0,normal ov's bilat,cx 3.5 cm,svp of fluid 4.5 cm,fhr 146 bpm,efw 297 g,anatomy complete,no obvious abnormalities seen

## 2016-08-03 NOTE — Progress Notes (Signed)
G2P1001 [redacted]w[redacted]d Estimated Date of Delivery: 12/26/16  Blood pressure (!) 104/56, pulse 80, weight 183 lb (83 kg), last menstrual period 12/01/2015.   BP weight and urine results all reviewed and noted.  Please refer to the obstetrical flow sheet for the fundal height and fetal heart rate documentation:   [place] [id number] wks,cephalic,post pl gr 0,normal ov's bilat,cx 3.5 cm,svp of fluid 4.5 cm,fhr 146 bpm,efw 297 g,anatomy complete,no obvious abnormalities seen  Patient reports good fetal movement, denies any bleeding and no rupture of membranes symptoms or regular contractions. Patient is without complaints. All questions were answered.  Orders Placed This Encounter  Procedures  . POCT urinalysis dipstick    Plan:  Continued routine obstetrical care,   Return in about 4 weeks (around 08/31/2016) for LROB.

## 2016-08-05 ENCOUNTER — Telehealth: Payer: Self-pay | Admitting: Women's Health

## 2016-08-05 NOTE — Telephone Encounter (Signed)
PT STATES SHE HAS BEEN TO THE DENTIST AND HAS BEENPRESCRIBED AMOXICILLIN. SHE IS [redacted] WEEKS PREGNANT. CAN SHE TAKE THIS MEDICAITON.  PLEASE ADVISE

## 2016-08-09 ENCOUNTER — Ambulatory Visit (INDEPENDENT_AMBULATORY_CARE_PROVIDER_SITE_OTHER): Payer: Medicaid Other | Admitting: Family Medicine

## 2016-08-09 ENCOUNTER — Encounter: Payer: Self-pay | Admitting: Family Medicine

## 2016-08-09 VITALS — BP 104/72 | Temp 98.8°F | Ht 66.0 in | Wt 185.0 lb

## 2016-08-09 DIAGNOSIS — J329 Chronic sinusitis, unspecified: Secondary | ICD-10-CM | POA: Diagnosis not present

## 2016-08-09 DIAGNOSIS — J209 Acute bronchitis, unspecified: Secondary | ICD-10-CM | POA: Diagnosis not present

## 2016-08-09 MED ORDER — AZITHROMYCIN 250 MG PO TABS: ORAL_TABLET | ORAL | 0 refills | Status: DC

## 2016-08-09 NOTE — Telephone Encounter (Signed)
Spoke with pt letting her know Amoxicillin was safe to take during pregnancy per Maudie Mercury, CNM. Also, it's ok for pt to have a root canal. Pt voiced understanding. [place]

## 2016-08-09 NOTE — Progress Notes (Signed)
   Subjective:    Patient ID: Paula Houston, female    DOB: June 24, 1996, 20 y.o.   MRN: [id number]  Cough  This is a new problem. The current episode started in the past 7 days. The problem has been unchanged. The cough is non-productive. Associated symptoms include headaches and a sore throat. Nothing aggravates the symptoms. She has tried nothing for the symptoms. The treatment provided no relief.   Sat night fet bad and achey and itchy throat   Cough off and on, No hi feve, pos low gr fever  Twenty weeks preg    Review of Systems  HENT: Positive for sore throat.   Respiratory: Positive for cough.   Neurological: Positive for headaches.       Objective:   Physical Exam Alert, mild malaise. Hydration good Vitals stable. frontal/ maxillary tenderness evident positive nasal congestion. pharynx normal neck supple  lungs clear/no crackles or wheezes. heart regular in rhythm        Assessment & Plan:  Impression rhinosinusitis likely post viral, discussed with patient. plan antibiotics prescribed. Questions answered. Symptomatic care discussed. warning signs discussed. WSL

## 2016-08-12 ENCOUNTER — Telehealth: Payer: Self-pay | Admitting: *Deleted

## 2016-08-12 NOTE — Telephone Encounter (Signed)
Pt states she did not feel the baby move as much yesterday, but baby is moving today, no vaginal bleeding, no cramping, no gush of fluids. Pt informed + FM today, still early pregnancy may not feel the baby move as consistent as she will as her pregnancy progresses. Pt informed of early pregnancy precautions. Pt verbalized understanding.

## 2016-08-26 ENCOUNTER — Telehealth: Payer: Self-pay | Admitting: *Deleted

## 2016-08-26 NOTE — Telephone Encounter (Signed)
Pt c/o shortness of breath with walking, and at times when sitting. Pt states has had happen before but just started again this am. Spoke with Derrek Monaco, NP and states pt can be given an appt for tomorrow for evaluation. Pt scheduled with Dr. Glo Herring 08/27/2016 @ 1015.

## 2016-08-27 ENCOUNTER — Encounter: Payer: Self-pay | Admitting: Obstetrics and Gynecology

## 2016-08-27 ENCOUNTER — Ambulatory Visit (INDEPENDENT_AMBULATORY_CARE_PROVIDER_SITE_OTHER): Payer: Medicaid Other | Admitting: Obstetrics and Gynecology

## 2016-08-27 VITALS — BP 110/54 | HR 96 | Wt 185.0 lb

## 2016-08-27 DIAGNOSIS — Z3482 Encounter for supervision of other normal pregnancy, second trimester: Secondary | ICD-10-CM

## 2016-08-27 DIAGNOSIS — Z3A23 23 weeks gestation of pregnancy: Secondary | ICD-10-CM

## 2016-08-27 DIAGNOSIS — Z331 Pregnant state, incidental: Secondary | ICD-10-CM

## 2016-08-27 DIAGNOSIS — Z1389 Encounter for screening for other disorder: Secondary | ICD-10-CM

## 2016-08-27 DIAGNOSIS — O99012 Anemia complicating pregnancy, second trimester: Secondary | ICD-10-CM

## 2016-08-27 DIAGNOSIS — Z3402 Encounter for supervision of normal first pregnancy, second trimester: Secondary | ICD-10-CM

## 2016-08-27 LAB — POCT URINALYSIS DIPSTICK
GLUCOSE UA: NEGATIVE
Glucose, UA: NEGATIVE
Ketones, UA: NEGATIVE
NITRITE UA: NEGATIVE
PROTEIN UA: NEGATIVE
Protein, UA: NEGATIVE
RBC UA: NEGATIVE

## 2016-08-27 NOTE — Progress Notes (Signed)
G2P1001  Estimated Date of Delivery: 12/26/16 LROB [redacted]w[redacted]d  Blood pressure (!) 110/54, pulse 96, weight 185 lb (83.9 kg), last menstrual period 12/01/2015.   Urine results:notable for trace leukocytes   Chief Complaint  Patient presents with  . W/I OB    gets winded when walking, N/V today    Patient complaints: She reports feeling winded with normal activity such as washing dishes and walking in a store.  Patient reports   good fetal movement,                           denies any bleeding , rupture of membranes,or regular contractions.   refer to the ob flow sheet for FH and FHR, ,                          Physical Examination: General appearance - alert, well appearing, and in no distress                                      Cardiac - 88 resting          108 after light activity    Abdomen - FH 23,                                                         -FHR 151                                                         soft, nontender, nondistended, no masses or organomegaly                                      Pelvic - examination not indicated                                      Edema - none   Questions were answered. Assessment: LROB G2P1001 @ [redacted]w[redacted]d, SOB probably physiologic changes.   Plan:  Continued routine obstetrical care, double vitamins, check CBC  F/u results by MyChart   By signing my name below, I, Sonum Patel, attest that this documentation has been prepared under the direction and in the presence of Jonnie Kind, MD. Electronically Signed: Sonum Patel, Education administrator. 08/27/16. 10:47 AM.  I personally performed the services described in this documentation, which was SCRIBED in my presence. The recorded information has been reviewed and considered accurate. It has been edited as necessary during review. Jonnie Kind, MD

## 2016-08-28 LAB — CBC
HEMOGLOBIN: 11.7 g/dL (ref 11.1–15.9)
Hematocrit: 34.8 % (ref 34.0–46.6)
MCH: 31 pg (ref 26.6–33.0)
MCHC: 33.6 g/dL (ref 31.5–35.7)
MCV: 92 fL (ref 79–97)
PLATELETS: 508 10*3/uL — AB (ref 150–379)
RBC: 3.78 x10E6/uL (ref 3.77–5.28)
RDW: 13.5 % (ref 12.3–15.4)
WBC: 13.2 10*3/uL — ABNORMAL HIGH (ref 3.4–10.8)

## 2016-08-31 ENCOUNTER — Encounter: Payer: Medicaid Other | Admitting: Advanced Practice Midwife

## 2016-09-23 ENCOUNTER — Ambulatory Visit (INDEPENDENT_AMBULATORY_CARE_PROVIDER_SITE_OTHER): Payer: Medicaid Other | Admitting: Women's Health

## 2016-09-23 VITALS — BP 100/52 | HR 87 | Wt 190.0 lb

## 2016-09-23 DIAGNOSIS — Z331 Pregnant state, incidental: Secondary | ICD-10-CM

## 2016-09-23 DIAGNOSIS — Z3482 Encounter for supervision of other normal pregnancy, second trimester: Secondary | ICD-10-CM

## 2016-09-23 DIAGNOSIS — Z1389 Encounter for screening for other disorder: Secondary | ICD-10-CM

## 2016-09-23 DIAGNOSIS — Z3A27 27 weeks gestation of pregnancy: Secondary | ICD-10-CM

## 2016-09-23 LAB — POCT URINALYSIS DIPSTICK
Blood, UA: NEGATIVE
Glucose, UA: NEGATIVE
KETONES UA: NEGATIVE
Leukocytes, UA: NEGATIVE
Nitrite, UA: NEGATIVE
PROTEIN UA: NEGATIVE

## 2016-09-23 NOTE — Patient Instructions (Signed)
You will have your sugar test next visit.  Please do not eat or drink anything after midnight the night before you come, not even water.  You will be here for at least two hours.     Call the office [phone]) or go to [facility] if:  You begin to have strong, frequent contractions  Your water breaks.  Sometimes it is a big gush of fluid, sometimes it is just a trickle that keeps getting your panties wet or running down your legs  You have vaginal bleeding.  It is normal to have a small amount of spotting if your cervix was checked.   You don't feel your baby moving like normal.  If you don't, get you something to eat and drink and lay down and focus on feeling your baby move.   If your baby is still not moving like normal, you should call the office or go to [facility] P[facility] The second trimester is from week 13 through week 28, months 4 through 6. The second trimester is often a time when you feel your best. Your body has also adjusted to being pregnant, and you begin to feel better physically. Usually, morning sickness has lessened or quit completely, you may have more energy, and you may have an increase in appetite. The second trimester is also a time when the fetus is growing rapidly. At the end of the sixth month, the fetus is about 9 inches long and weighs about 1 pounds. You will likely begin to feel the baby move (quickening) between 18 and 20 weeks of the pregnancy. BODY CHANGES Your body goes through many changes during pregnancy. The changes vary from woman to woman.   Your weight will continue to increase. You will notice your lower abdomen bulging out.  You may begin to get stretch marks on your hips, abdomen, and breasts.  You may develop headaches that can be relieved by medicines approved by your health care provider.  You may urinate more often because the fetus is pressing on your bladder.  You may develop or continue to have  heartburn as a result of your pregnancy.  You may develop constipation because certain hormones are causing the muscles that push waste through your intestines to slow down.  You may develop hemorrhoids or swollen, bulging veins (varicose veins).  You may have back pain because of the weight gain and pregnancy hormones relaxing your joints between the bones in your pelvis and as a result of a shift in weight and the muscles that support your balance.  Your breasts will continue to grow and be tender.  Your gums may bleed and may be sensitive to brushing and flossing.  Dark spots or blotches (chloasma, mask of pregnancy) may develop on your face. This will likely fade after the baby is born.  A dark line from your belly button to the pubic area (linea nigra) may appear. This will likely fade after the baby is born.  You may have changes in your hair. These can include thickening of your hair, rapid growth, and changes in texture. Some women also have hair loss during or after pregnancy, or hair that feels dry or thin. Your hair will most likely return to normal after your baby is born. WHAT TO EXPECT AT YOUR PRENATAL VISITS During a routine prenatal visit:  You will be weighed to make sure you and the fetus are growing normally.  Your blood pressure will be taken.  Your abdomen will be measured to track your baby's growth.  The fetal heartbeat will be listened to.  Any test results from the previous visit will be discussed. Your health care provider may ask you:  How you are feeling.  If you are feeling the baby move.  If you have had any abnormal symptoms, such as leaking fluid, bleeding, severe headaches, or abdominal cramping.  If you have any questions. Other tests that may be performed during your second trimester include:  Blood tests that check for:  Low iron levels (anemia).  Gestational diabetes (between 24 and 28 weeks).  Rh antibodies.  Urine tests to check  for infections, diabetes, or protein in the urine.  An ultrasound to confirm the proper growth and development of the baby.  An amniocentesis to check for possible genetic problems.  Fetal screens for spina bifida and Down syndrome. HOME CARE INSTRUCTIONS   Avoid all smoking, herbs, alcohol, and unprescribed drugs. These chemicals affect the formation and growth of the baby.  Follow your health care provider's instructions regarding medicine use. There are medicines that are either safe or unsafe to take during pregnancy.  Exercise only as directed by your health care provider. Experiencing uterine cramps is a good sign to stop exercising.  Continue to eat regular, healthy meals.  Wear a good support bra for breast tenderness.  Do not use hot tubs, steam rooms, or saunas.  Wear your seat belt at all times when driving.  Avoid raw meat, uncooked cheese, cat litter boxes, and soil used by cats. These carry germs that can cause birth defects in the baby.  Take your prenatal vitamins.  Try taking a stool softener (if your health care provider approves) if you develop constipation. Eat more high-fiber foods, such as fresh vegetables or fruit and whole grains. Drink plenty of fluids to keep your urine clear or pale yellow.  Take warm sitz baths to soothe any pain or discomfort caused by hemorrhoids. Use hemorrhoid cream if your health care provider approves.  If you develop varicose veins, wear support hose. Elevate your feet for 15 minutes, 3-4 times a day. Limit salt in your diet.  Avoid heavy lifting, wear low heel shoes, and practice good posture.  Rest with your legs elevated if you have leg cramps or low back pain.  Visit your dentist if you have not gone yet during your pregnancy. Use a soft toothbrush to brush your teeth and be gentle when you floss.  A sexual relationship may be continued unless your health care provider directs you otherwise.  Continue to go to all your  prenatal visits as directed by your health care provider. SEEK MEDICAL CARE IF:   You have dizziness.  You have mild pelvic cramps, pelvic pressure, or nagging pain in the abdominal area.  You have persistent nausea, vomiting, or diarrhea.  You have a bad smelling vaginal discharge.  You have pain with urination. SEEK IMMEDIATE MEDICAL CARE IF:   You have a fever.  You are leaking fluid from your vagina.  You have spotting or bleeding from your vagina.  You have severe abdominal cramping or pain.  You have rapid weight gain or loss.  You have shortness of breath with chest pain.  You notice sudden or extreme swelling of your face, hands, ankles, feet, or legs.  You have not felt your baby move in over an hour.  You have severe headaches that do not go away with medicine.  You have vision changes.  Document Released: 09/21/2001 Document Revised: 10/02/2013 Document Reviewed: 11/28/2012 ExitCare Patient Information 2015 [employer]. This information is not intended to replace advice given to you by your health care provider. Make sure you discuss any questions you have with your health care provider.     

## 2016-09-23 NOTE — Progress Notes (Signed)
Low-risk OB appointment G2P1001 [redacted]w[redacted]d Estimated Date of Delivery: 12/26/16 BP (!) 100/52   Pulse 87   Wt 190 lb (86.2 kg)   LMP 12/01/2015   BMI 30.67 kg/m   BP, weight, and urine reviewed.  Refer to obstetrical flow sheet for FH & FHR.  Reports good fm.  Denies regular uc's, lof, vb, or uti s/s. No complaints. Reviewed ptl s/s, fm. Recommended flu shot w/ pcp/hd (<21yo)  Plan:  Continue routine obstetrical care  F/U in 2wks for OB appointment and pn2

## 2016-09-24 ENCOUNTER — Encounter: Payer: Medicaid Other | Admitting: Women's Health

## 2016-10-08 ENCOUNTER — Ambulatory Visit (INDEPENDENT_AMBULATORY_CARE_PROVIDER_SITE_OTHER): Payer: Medicaid Other | Admitting: Obstetrics & Gynecology

## 2016-10-08 ENCOUNTER — Encounter: Payer: Self-pay | Admitting: Obstetrics & Gynecology

## 2016-10-08 ENCOUNTER — Other Ambulatory Visit: Payer: Medicaid Other

## 2016-10-08 VITALS — BP 119/63 | HR 108 | Wt 192.0 lb

## 2016-10-08 DIAGNOSIS — Z3483 Encounter for supervision of other normal pregnancy, third trimester: Secondary | ICD-10-CM

## 2016-10-08 DIAGNOSIS — Z331 Pregnant state, incidental: Secondary | ICD-10-CM

## 2016-10-08 DIAGNOSIS — Z3A29 29 weeks gestation of pregnancy: Secondary | ICD-10-CM

## 2016-10-08 DIAGNOSIS — Z3403 Encounter for supervision of normal first pregnancy, third trimester: Secondary | ICD-10-CM

## 2016-10-08 DIAGNOSIS — Z131 Encounter for screening for diabetes mellitus: Secondary | ICD-10-CM

## 2016-10-08 DIAGNOSIS — Z1389 Encounter for screening for other disorder: Secondary | ICD-10-CM

## 2016-10-08 LAB — POCT URINALYSIS DIPSTICK
Blood, UA: NEGATIVE
GLUCOSE UA: NEGATIVE
KETONES UA: NEGATIVE
Nitrite, UA: NEGATIVE

## 2016-10-08 NOTE — Progress Notes (Signed)
G2P1001 [redacted]w[redacted]d Estimated Date of Delivery: 12/26/16  Blood pressure 119/63, pulse (!) 108, weight 192 lb (87.1 kg), last menstrual period 12/01/2015.   BP weight and urine results all reviewed and noted.  Please refer to the obstetrical flow sheet for the fundal height and fetal heart rate documentation:  Patient reports good fetal movement, denies any bleeding and no rupture of membranes symptoms or regular contractions. Patient is without complaints. All questions were answered.  Orders Placed This Encounter  Procedures  . POCT urinalysis dipstick    Plan:  Continued routine obstetrical care, PN2 today  Return in about 3 weeks (around 10/29/2016) for LROB.

## 2016-10-09 LAB — CBC
HEMATOCRIT: 35.2 % (ref 34.0–46.6)
HEMOGLOBIN: 11.7 g/dL (ref 11.1–15.9)
MCH: 30.3 pg (ref 26.6–33.0)
MCHC: 33.2 g/dL (ref 31.5–35.7)
MCV: 91 fL (ref 79–97)
Platelets: 460 10*3/uL — ABNORMAL HIGH (ref 150–379)
RBC: 3.86 x10E6/uL (ref 3.77–5.28)
RDW: 13.6 % (ref 12.3–15.4)
WBC: 14.2 10*3/uL — ABNORMAL HIGH (ref 3.4–10.8)

## 2016-10-09 LAB — RPR: RPR: NONREACTIVE

## 2016-10-09 LAB — GLUCOSE TOLERANCE, 2 HOURS W/ 1HR
GLUCOSE, 1 HOUR: 167 mg/dL (ref 65–179)
Glucose, 2 hour: 129 mg/dL (ref 65–152)
Glucose, Fasting: 79 mg/dL (ref 65–91)

## 2016-10-09 LAB — ANTIBODY SCREEN: ANTIBODY SCREEN: NEGATIVE

## 2016-10-09 LAB — HIV ANTIBODY (ROUTINE TESTING W REFLEX): HIV Screen 4th Generation wRfx: NONREACTIVE

## 2016-10-27 ENCOUNTER — Encounter: Payer: Medicaid Other | Admitting: Obstetrics and Gynecology

## 2016-10-29 ENCOUNTER — Encounter: Payer: Self-pay | Admitting: Obstetrics & Gynecology

## 2016-10-29 ENCOUNTER — Ambulatory Visit (INDEPENDENT_AMBULATORY_CARE_PROVIDER_SITE_OTHER): Payer: Medicaid Other | Admitting: Obstetrics & Gynecology

## 2016-10-29 VITALS — BP 104/74 | HR 94 | Wt 195.0 lb

## 2016-10-29 DIAGNOSIS — Z1389 Encounter for screening for other disorder: Secondary | ICD-10-CM

## 2016-10-29 DIAGNOSIS — Z3403 Encounter for supervision of normal first pregnancy, third trimester: Secondary | ICD-10-CM

## 2016-10-29 DIAGNOSIS — Z3A32 32 weeks gestation of pregnancy: Secondary | ICD-10-CM

## 2016-10-29 DIAGNOSIS — Z331 Pregnant state, incidental: Secondary | ICD-10-CM

## 2016-10-29 LAB — POCT URINALYSIS DIPSTICK
GLUCOSE UA: NEGATIVE
Ketones, UA: NEGATIVE
NITRITE UA: NEGATIVE

## 2016-10-29 NOTE — Progress Notes (Signed)
G2P1001 [redacted]w[redacted]d Estimated Date of Delivery: 12/26/16  Blood pressure 104/74, pulse 94, weight 195 lb (88.5 kg), last menstrual period 12/01/2015.   BP weight and urine results all reviewed and noted.  Please refer to the obstetrical flow sheet for the fundal height and fetal heart rate documentation:  Patient reports good fetal movement, denies any bleeding and no rupture of membranes symptoms or regular contractions. Patient is without complaints. All questions were answered.  Orders Placed This Encounter  Procedures  . POCT urinalysis dipstick    Plan:  Continued routine obstetrical care,   Return in about 2 weeks (around 11/12/2016) for LROB.

## 2016-11-09 ENCOUNTER — Telehealth: Payer: Self-pay | Admitting: *Deleted

## 2016-11-09 NOTE — Telephone Encounter (Signed)
Patient called with complaints of mild cramping, no bleeding or leaking of fluid. I encouraged patient to increase her fluid intake and to empty her bladder frequently. Also to try sleeping with a pillow under her belly at night and to try supporting her belly when standing or sitting. Informed patient that description of discomfort sounded like round ligament pain. Pt verbalized understanding. No further questions.

## 2016-11-15 ENCOUNTER — Ambulatory Visit (INDEPENDENT_AMBULATORY_CARE_PROVIDER_SITE_OTHER): Payer: Medicaid Other | Admitting: Women's Health

## 2016-11-15 ENCOUNTER — Encounter: Payer: Self-pay | Admitting: Women's Health

## 2016-11-15 VITALS — BP 106/54 | HR 82 | Wt 197.0 lb

## 2016-11-15 DIAGNOSIS — Z3A34 34 weeks gestation of pregnancy: Secondary | ICD-10-CM

## 2016-11-15 DIAGNOSIS — Z331 Pregnant state, incidental: Secondary | ICD-10-CM

## 2016-11-15 DIAGNOSIS — Z3483 Encounter for supervision of other normal pregnancy, third trimester: Secondary | ICD-10-CM

## 2016-11-15 DIAGNOSIS — Z1389 Encounter for screening for other disorder: Secondary | ICD-10-CM

## 2016-11-15 LAB — POCT URINALYSIS DIPSTICK
GLUCOSE UA: NEGATIVE
Ketones, UA: NEGATIVE
NITRITE UA: NEGATIVE
RBC UA: NEGATIVE

## 2016-11-15 NOTE — Progress Notes (Signed)
Low-risk OB appointment G2P1001 [redacted]w[redacted]d Estimated Date of Delivery: 12/26/16 BP (!) 106/54   Pulse 82   Wt 197 lb (89.4 kg)   LMP 12/01/2015   BMI 31.80 kg/m   BP, weight, and urine reviewed.  Refer to obstetrical flow sheet for FH & FHR.  Reports good fm.  Denies regular uc's, lof, vb, or uti s/s. No complaints. Reviewed ptl s/s, fkc. Recommended Tdap at HD/PCP per CDC guidelines.  Plan:  Continue routine obstetrical care  F/U in 2wks for OB appointment

## 2016-11-15 NOTE — Patient Instructions (Signed)
Call the office [phone]) or go to [facility] if:  You begin to have strong, frequent contractions  Your water breaks.  Sometimes it is a big gush of fluid, sometimes it is just a trickle that keeps getting your panties wet or running down your legs  You have vaginal bleeding.  It is normal to have a small amount of spotting if your cervix was checked.   You don't feel your baby moving like normal.  If you don't, get you something to eat and drink and lay down and focus on feeling your baby move.  You should feel at least 10 movements in 2 hours.  If you don't, you should call the office or go to [facility].    Tdap Vaccine  It is recommended that you get the Tdap vaccine during the third trimester of EACH pregnancy to help protect your baby from getting pertussis (whooping cough)  27-36 weeks is the BEST time to do this so that you can pass the protection on to your baby. During pregnancy is better than after pregnancy, but if you are unable to get it during pregnancy it will be offered at the hospital.   You can get this vaccine at the health department or your family doctor  Everyone who will be around your baby should also be up-to-date on their vaccines. Adults (who are not pregnant) only need 1 dose of Tdap during adulthood.     Preterm Labor and Birth Information The normal length of a pregnancy is 39-41 weeks. Preterm labor is when labor starts before 37 completed weeks of pregnancy. What are the risk factors for preterm labor? Preterm labor is more likely to occur in women who:  Have certain infections during pregnancy such as a bladder infection, sexually transmitted infection, or infection inside the uterus (chorioamnionitis).  Have a shorter-than-normal cervix.  Have gone into preterm labor before.  Have had surgery on their cervix.  Are younger than age 70 or older than age 58.  Are African American.  Are pregnant with twins or multiple babies (multiple  gestation).  Take street drugs or smoke while pregnant.  Do not gain enough weight while pregnant.  Became pregnant shortly after having been pregnant. What are the symptoms of preterm labor? Symptoms of preterm labor include:  Cramps similar to those that can happen during a menstrual period. The cramps may happen with diarrhea.  Pain in the abdomen or lower back.  Regular uterine contractions that may feel like tightening of the abdomen.  A feeling of increased pressure in the pelvis.  Increased watery or bloody mucus discharge from the vagina.  Water breaking (ruptured amniotic sac). Why is it important to recognize signs of preterm labor? It is important to recognize signs of preterm labor because babies who are born prematurely may not be fully developed. This can put them at an increased risk for:  Long-term (chronic) heart and lung problems.  Difficulty immediately after birth with regulating body systems, including blood sugar, body temperature, heart rate, and breathing rate.  Bleeding in the brain.  Cerebral palsy.  Learning difficulties.  Death. These risks are highest for babies who are born before 46 weeks of pregnancy. How is preterm labor treated? Treatment depends on the length of your pregnancy, your condition, and the health of your baby. It may involve:  Having a stitch (suture) placed in your cervix to prevent your cervix from opening too early (cerclage).  Taking or being given medicines, such as:  Hormone medicines. These may be given early in pregnancy to help support the pregnancy.  Medicine to stop contractions.  Medicines to help mature the baby's lungs. These may be prescribed if the risk of delivery is high.  Medicines to prevent your baby from developing cerebral palsy. If the labor happens before 34 weeks of pregnancy, you may need to stay in the hospital. What should I do if I think I am in preterm labor? If you think that you are  going into preterm labor, call your health care provider right away. How can I prevent preterm labor in future pregnancies? To increase your chance of having a full-term pregnancy:  Do not use any tobacco products, such as cigarettes, chewing tobacco, and e-cigarettes. If you need help quitting, ask your health care provider.  Do not use street drugs or medicines that have not been prescribed to you during your pregnancy.  Talk with your health care provider before taking any herbal supplements, even if you have been taking them regularly.  Make sure you gain a healthy amount of weight during your pregnancy.  Watch for infection. If you think that you might have an infection, get it checked right away.  Make sure to tell your health care provider if you have gone into preterm labor before. This information is not intended to replace advice given to you by your health care provider. Make sure you discuss any questions you have with your health care provider. Document Released: 12/18/2003 Document Revised: 03/09/2016 Document Reviewed: 02/18/2016 Elsevier Interactive Patient Education  2017 [employer].

## 2016-11-29 ENCOUNTER — Encounter: Payer: Self-pay | Admitting: Women's Health

## 2016-11-29 ENCOUNTER — Ambulatory Visit (INDEPENDENT_AMBULATORY_CARE_PROVIDER_SITE_OTHER): Payer: Medicaid Other | Admitting: Women's Health

## 2016-11-29 VITALS — BP 110/64 | HR 84 | Wt 199.0 lb

## 2016-11-29 DIAGNOSIS — Z1389 Encounter for screening for other disorder: Secondary | ICD-10-CM

## 2016-11-29 DIAGNOSIS — Z331 Pregnant state, incidental: Secondary | ICD-10-CM

## 2016-11-29 DIAGNOSIS — Z3483 Encounter for supervision of other normal pregnancy, third trimester: Secondary | ICD-10-CM

## 2016-11-29 DIAGNOSIS — Z3A36 36 weeks gestation of pregnancy: Secondary | ICD-10-CM

## 2016-11-29 LAB — POCT URINALYSIS DIPSTICK
Glucose, UA: NEGATIVE
KETONES UA: NEGATIVE
Nitrite, UA: NEGATIVE
PROTEIN UA: NEGATIVE
RBC UA: NEGATIVE

## 2016-11-29 NOTE — Patient Instructions (Signed)
Call the office ([phone]) or go to [facility] if:  You begin to have strong, frequent contractions  Your water breaks.  Sometimes it is a big gush of fluid, sometimes it is just a trickle that keeps getting your panties wet or running down your legs  You have vaginal bleeding.  It is normal to have a small amount of spotting if your cervix was checked.   You don't feel your baby moving like normal.  If you don't, get you something to eat and drink and lay down and focus on feeling your baby move.  You should feel at least 10 movements in 2 hours.  If you don't, you should call the office or go to [facility].     Preterm Labor and Birth Information The normal length of a pregnancy is 39-41 weeks. Preterm labor is when labor starts before 37 completed weeks of pregnancy. What are the risk factors for preterm labor? Preterm labor is more likely to occur in women who:  Have certain infections during pregnancy such as a bladder infection, sexually transmitted infection, or infection inside the uterus (chorioamnionitis).  Have a shorter-than-normal cervix.  Have gone into preterm labor before.  Have had surgery on their cervix.  Are younger than age 17 or older than age 35.  Are African American.  Are pregnant with twins or multiple babies (multiple gestation).  Take street drugs or smoke while pregnant.  Do not gain enough weight while pregnant.  Became pregnant shortly after having been pregnant. What are the symptoms of preterm labor? Symptoms of preterm labor include:  Cramps similar to those that can happen during a menstrual period. The cramps may happen with diarrhea.  Pain in the abdomen or lower back.  Regular uterine contractions that may feel like tightening of the abdomen.  A feeling of increased pressure in the pelvis.  Increased watery or bloody mucus discharge from the vagina.  Water breaking (ruptured amniotic sac). Why is it important to  recognize signs of preterm labor? It is important to recognize signs of preterm labor because babies who are born prematurely may not be fully developed. This can put them at an increased risk for:  Long-term (chronic) heart and lung problems.  Difficulty immediately after birth with regulating body systems, including blood sugar, body temperature, heart rate, and breathing rate.  Bleeding in the brain.  Cerebral palsy.  Learning difficulties.  Death. These risks are highest for babies who are born before 34 weeks of pregnancy. How is preterm labor treated? Treatment depends on the length of your pregnancy, your condition, and the health of your baby. It may involve:  Having a stitch (suture) placed in your cervix to prevent your cervix from opening too early (cerclage).  Taking or being given medicines, such as:  Hormone medicines. These may be given early in pregnancy to help support the pregnancy.  Medicine to stop contractions.  Medicines to help mature the baby's lungs. These may be prescribed if the risk of delivery is high.  Medicines to prevent your baby from developing cerebral palsy. If the labor happens before 34 weeks of pregnancy, you may need to stay in the hospital. What should I do if I think I am in preterm labor? If you think that you are going into preterm labor, call your health care provider right away. How can I prevent preterm labor in future pregnancies? To increase your chance of having a full-term pregnancy:  Do not use any tobacco products, such   as cigarettes, chewing tobacco, and e-cigarettes. If you need help quitting, ask your health care provider.  Do not use street drugs or medicines that have not been prescribed to you during your pregnancy.  Talk with your health care provider before taking any herbal supplements, even if you have been taking them regularly.  Make sure you gain a healthy amount of weight during your pregnancy.  Watch for  infection. If you think that you might have an infection, get it checked right away.  Make sure to tell your health care provider if you have gone into preterm labor before. This information is not intended to replace advice given to you by your health care provider. Make sure you discuss any questions you have with your health care provider. Document Released: 12/18/2003 Document Revised: 03/09/2016 Document Reviewed: 02/18/2016 Elsevier Interactive Patient Education  2017 [employer]  

## 2016-11-29 NOTE — Progress Notes (Signed)
Low-risk OB appointment G2P1001 [redacted]w[redacted]d Estimated Date of Delivery: 12/26/16 BP 110/64   Pulse 84   Wt 199 lb (90.3 kg)   LMP 12/01/2015   BMI 32.12 kg/m   BP, weight, and urine reviewed.  Refer to obstetrical flow sheet for FH & FHR.  Reports good fm.  Denies regular uc's, lof, vb, or uti s/s. No complaints. Reviewed ptl s/s, fkc. Plan:  Continue routine obstetrical care  F/U in 1wk for OB appointment and gbs

## 2016-12-06 ENCOUNTER — Encounter: Payer: Self-pay | Admitting: Women's Health

## 2016-12-06 ENCOUNTER — Ambulatory Visit (INDEPENDENT_AMBULATORY_CARE_PROVIDER_SITE_OTHER): Payer: Medicaid Other | Admitting: Women's Health

## 2016-12-06 VITALS — BP 112/68 | HR 105 | Wt 199.8 lb

## 2016-12-06 DIAGNOSIS — Z3A37 37 weeks gestation of pregnancy: Secondary | ICD-10-CM

## 2016-12-06 DIAGNOSIS — Z1389 Encounter for screening for other disorder: Secondary | ICD-10-CM

## 2016-12-06 DIAGNOSIS — Z331 Pregnant state, incidental: Secondary | ICD-10-CM

## 2016-12-06 DIAGNOSIS — Z3483 Encounter for supervision of other normal pregnancy, third trimester: Secondary | ICD-10-CM

## 2016-12-06 LAB — POCT URINALYSIS DIPSTICK
Blood, UA: NEGATIVE
Glucose, UA: NEGATIVE
KETONES UA: NEGATIVE
Nitrite, UA: NEGATIVE
Protein, UA: NEGATIVE

## 2016-12-06 NOTE — Patient Instructions (Signed)
Monistat 7 for yeast  Call the office [phone]) or go to [facility] if:  You begin to have strong, frequent contractions  Your water breaks.  Sometimes it is a big gush of fluid, sometimes it is just a trickle that keeps getting your panties wet or running down your legs  You have vaginal bleeding.  It is normal to have a small amount of spotting if your cervix was checked.   You don't feel your baby moving like normal.  If you don't, get you something to eat and drink and lay down and focus on feeling your baby move.  You should feel at least 10 movements in 2 hours.  If you don't, you should call the office or go to [facility] Contractions Contractions of the uterus can occur throughout pregnancy. Contractions are not always a sign that you are in labor.  WHAT ARE BRAXTON HICKS CONTRACTIONS?  Contractions that occur before labor are called Braxton Hicks contractions, or false labor. Toward the end of pregnancy (32-34 weeks), these contractions can develop more often and may become more forceful. This is not true labor because these contractions do not result in opening (dilatation) and thinning of the cervix. They are sometimes difficult to tell apart from true labor because these contractions can be forceful and people have different pain tolerances. You should not feel embarrassed if you go to the hospital with false labor. Sometimes, the only way to tell if you are in true labor is for your health care provider to look for changes in the cervix. If there are no prenatal problems or other health problems associated with the pregnancy, it is completely safe to be sent home with false labor and await the onset of true labor. HOW CAN YOU TELL THE DIFFERENCE BETWEEN TRUE AND FALSE LABOR? False Labor   The contractions of false labor are usually shorter and not as hard as those of true labor.   The contractions are usually irregular.   The contractions are  often felt in the front of the lower abdomen and in the groin.   The contractions may go away when you walk around or change positions while lying down.   The contractions get weaker and are shorter lasting as time goes on.   The contractions do not usually become progressively stronger, regular, and closer together as with true labor.  True Labor   Contractions in true labor last 30-70 seconds, become very regular, usually become more intense, and increase in frequency.   The contractions do not go away with walking.   The discomfort is usually felt in the top of the uterus and spreads to the lower abdomen and low back.   True labor can be determined by your health care provider with an exam. This will show that the cervix is dilating and getting thinner.  WHAT TO REMEMBER  Keep up with your usual exercises and follow other instructions given by your health care provider.   Take medicines as directed by your health care provider.   Keep your regular prenatal appointments.   Eat and drink lightly if you think you are going into labor.   If Braxton Hicks contractions are making you uncomfortable:   Change your position from lying down or resting to walking, or from walking to resting.   Sit and rest in a tub of warm water.   Drink 2-3 glasses of water. Dehydration may cause these contractions.   Do  slow and deep breathing several times an hour.  WHEN SHOULD I SEEK IMMEDIATE MEDICAL CARE? Seek immediate medical care if:  Your contractions become stronger, more regular, and closer together.   You have fluid leaking or gushing from your vagina.   You have a fever.   You pass blood-tinged mucus.   You have vaginal bleeding.   You have continuous abdominal pain.   You have low back pain that you never had before.   You feel your baby's head pushing down and causing pelvic pressure.   Your baby is not moving as much as it used to.  This  information is not intended to replace advice given to you by your health care provider. Make sure you discuss any questions you have with your health care provider. Document Released: 09/27/2005 Document Revised: 01/19/2016 Document Reviewed: 07/09/2013 Elsevier Interactive Patient Education  2017 [employer].

## 2016-12-06 NOTE — Progress Notes (Signed)
Low-risk OB appointment G2P1001 [redacted]w[redacted]d Estimated Date of Delivery: 12/26/16 BP 112/68   Pulse (!) 105   Wt 199 lb 12.8 oz (90.6 kg)   LMP 12/01/2015   BMI 32.25 kg/m   BP, weight, and urine reviewed.  Refer to obstetrical flow sheet for FH & FHR.  Reports good fm.  Denies regular uc's, lof, vb, or uti s/s. No complaints. GBS, gc/ct collected SVE per request: 3/50/-2, vtx Reviewed labor s/s, fkc. Plan:  Continue routine obstetrical care  F/U in 1wk for OB appointment

## 2016-12-07 LAB — GC/CHLAMYDIA PROBE AMP
CHLAMYDIA, DNA PROBE: NEGATIVE
NEISSERIA GONORRHOEAE BY PCR: NEGATIVE

## 2016-12-08 ENCOUNTER — Inpatient Hospital Stay (HOSPITAL_COMMUNITY)
Admission: AD | Admit: 2016-12-08 | Discharge: 2016-12-08 | Disposition: A | Discharge status: Home / Self Care | Payer: Medicaid Other | Source: Ambulatory Visit | Attending: Family Medicine | Admitting: Family Medicine

## 2016-12-08 ENCOUNTER — Encounter (HOSPITAL_COMMUNITY): Payer: Self-pay | Admitting: *Deleted

## 2016-12-08 ENCOUNTER — Other Ambulatory Visit: Payer: Self-pay | Admitting: Advanced Practice Midwife

## 2016-12-08 ENCOUNTER — Telehealth: Payer: Self-pay | Admitting: Obstetrics & Gynecology

## 2016-12-08 ENCOUNTER — Encounter: Payer: Self-pay | Admitting: Advanced Practice Midwife

## 2016-12-08 DIAGNOSIS — Z3A37 37 weeks gestation of pregnancy: Secondary | ICD-10-CM | POA: Diagnosis not present

## 2016-12-08 DIAGNOSIS — Z3483 Encounter for supervision of other normal pregnancy, third trimester: Secondary | ICD-10-CM

## 2016-12-08 MED ORDER — BUTALBITAL-APAP-CAFFEINE 50-325-40 MG PO TABS: 1.0000 | ORAL_TABLET | Freq: Four times a day (QID) | ORAL | 0 refills | Status: DC | PRN

## 2016-12-08 NOTE — Telephone Encounter (Signed)
Pt called stating that she would like for a nurse to give her a call. Pt did not state the reason for the call. Please contact pt

## 2016-12-08 NOTE — Discharge Instructions (Signed)

## 2016-12-08 NOTE — MAU Note (Signed)
Urine in lab 

## 2016-12-08 NOTE — MAU Note (Signed)
I have communicated with Dr Baron Sane and reviewed vital signs:  Vitals:   12/08/16 0842  BP: 120/75  Pulse: 113  Resp: 18  Temp: 98.3 F (36.8 C)    Vaginal exam:  Dilation: 3 Effacement (%): 50 Station: -2 Presentation: Vertex Exam by:: Devoria Glassing RN,   Also reviewed contraction pattern and that non-stress test is reactive.  It has been documented that patient is contracting occassionally with no cervical change over 2 days not indicating active labor.  Patient denies any other complaints.  Based on this report provider has given order for discharge.  A discharge diagnosis will be entered by a provider.  Labor discharge instructions reviewed with patient.

## 2016-12-08 NOTE — Telephone Encounter (Signed)
Patient called stating she was seen at [facility] today for headaches. She was discharged and was told to call us for a prescription for "something stronger than Tylenol". Please advise.

## 2016-12-08 NOTE — Progress Notes (Signed)
fioricet sent to [facility]

## 2016-12-08 NOTE — MAU Note (Signed)
3 cm on Monday in OB's office;having stronger  irregular ucs and vaginal pressure last night @ 0200; denies SROM; denies any vaginal spotting;

## 2016-12-09 LAB — CULTURE, BETA STREP (GROUP B ONLY): STREP GP B CULTURE: POSITIVE — AB

## 2016-12-13 ENCOUNTER — Ambulatory Visit (INDEPENDENT_AMBULATORY_CARE_PROVIDER_SITE_OTHER): Payer: Medicaid Other | Admitting: Women's Health

## 2016-12-13 ENCOUNTER — Encounter: Payer: Medicaid Other | Admitting: Obstetrics & Gynecology

## 2016-12-13 ENCOUNTER — Encounter: Payer: Self-pay | Admitting: Women's Health

## 2016-12-13 VITALS — BP 100/80 | HR 80 | Wt 197.0 lb

## 2016-12-13 DIAGNOSIS — Z3A38 38 weeks gestation of pregnancy: Secondary | ICD-10-CM

## 2016-12-13 DIAGNOSIS — Z3483 Encounter for supervision of other normal pregnancy, third trimester: Secondary | ICD-10-CM

## 2016-12-13 DIAGNOSIS — Z1389 Encounter for screening for other disorder: Secondary | ICD-10-CM

## 2016-12-13 DIAGNOSIS — Z331 Pregnant state, incidental: Secondary | ICD-10-CM

## 2016-12-13 LAB — POCT URINALYSIS DIPSTICK
Blood, UA: NEGATIVE
GLUCOSE UA: NEGATIVE
Ketones, UA: NEGATIVE
Leukocytes, UA: NEGATIVE
Nitrite, UA: NEGATIVE
Protein, UA: NEGATIVE

## 2016-12-13 NOTE — Patient Instructions (Signed)
Call the office ([phone]) or go to [facility] if:  You begin to have strong, frequent contractions  Your water breaks.  Sometimes it is a big gush of fluid, sometimes it is just a trickle that keeps getting your panties wet or running down your legs  You have vaginal bleeding.  It is normal to have a small amount of spotting if your cervix was checked.   You don't feel your baby moving like normal.  If you don't, get you something to eat and drink and lay down and focus on feeling your baby move.  You should feel at least 10 movements in 2 hours.  If you don't, you should call the office or go to [facility].     Braxton Hicks Contractions Contractions of the uterus can occur throughout pregnancy, but they are not always a sign that you are in labor. You may have practice contractions called Braxton Hicks contractions. These false labor contractions are sometimes confused with true labor. What are Braxton Hicks contractions? Braxton Hicks contractions are tightening movements that occur in the muscles of the uterus before labor. Unlike true labor contractions, these contractions do not result in opening (dilation) and thinning of the cervix. Toward the end of pregnancy (32-34 weeks), Braxton Hicks contractions can happen more often and may become stronger. These contractions are sometimes difficult to tell apart from true labor because they can be very uncomfortable. You should not feel embarrassed if you go to the hospital with false labor. Sometimes, the only way to tell if you are in true labor is for your health care provider to look for changes in the cervix. The health care provider will do a physical exam and may monitor your contractions. If you are not in true labor, the exam should show that your cervix is not dilating and your water has not broken. If there are no prenatal problems or other health problems associated with your pregnancy, it is completely safe for you to be sent  home with false labor. You may continue to have Braxton Hicks contractions until you go into true labor. How can I tell the difference between true labor and false labor?  Differences ? False labor ? Contractions last 30-70 seconds.: Contractions are usually shorter and not as strong as true labor contractions. ? Contractions become very regular.: Contractions are usually irregular. ? Discomfort is usually felt in the top of the uterus, and it spreads to the lower abdomen and low back.: Contractions are often felt in the front of the lower abdomen and in the groin. ? Contractions do not go away with walking.: Contractions may go away when you walk around or change positions while lying down. ? Contractions usually become more intense and increase in frequency.: Contractions get weaker and are shorter-lasting as time goes on. ? The cervix dilates and gets thinner.: The cervix usually does not dilate or become thin. Follow these instructions at home:  Take over-the-counter and prescription medicines only as told by your health care provider.  Keep up with your usual exercises and follow other instructions from your health care provider.  Eat and drink lightly if you think you are going into labor.  If Braxton Hicks contractions are making you uncomfortable: ? Change your position from lying down or resting to walking, or change from walking to resting. ? Sit and rest in a tub of warm water. ? Drink enough fluid to keep your urine clear or pale yellow. Dehydration may cause these contractions. ?   Do slow and deep breathing several times an hour.  Keep all follow-up prenatal visits as told by your health care provider. This is important. Contact a health care provider if:  You have a fever.  You have continuous pain in your abdomen. Get help right away if:  Your contractions become stronger, more regular, and closer together.  You have fluid leaking or gushing from your vagina.  You  pass blood-tinged mucus (bloody show).  You have bleeding from your vagina.  You have low back pain that you never had before.  You feel your baby's head pushing down and causing pelvic pressure.  Your baby is not moving inside you as much as it used to. Summary  Contractions that occur before labor are called Braxton Hicks contractions, false labor, or practice contractions.  Braxton Hicks contractions are usually shorter, weaker, farther apart, and less regular than true labor contractions. True labor contractions usually become progressively stronger and regular and they become more frequent.  Manage discomfort from Braxton Hicks contractions by changing position, resting in a warm bath, drinking plenty of water, or practicing deep breathing. This information is not intended to replace advice given to you by your health care provider. Make sure you discuss any questions you have with your health care provider. Document Released: 09/27/2005 Document Revised: 08/16/2016 Document Reviewed: 08/16/2016 Elsevier Interactive Patient Education  2017 [employer]  

## 2016-12-13 NOTE — Progress Notes (Signed)
Low-risk OB appointment G2P1001 [redacted]w[redacted]d Estimated Date of Delivery: 12/26/16 BP 100/80   Pulse 80   Wt 197 lb (89.4 kg)   LMP 12/01/2015   BMI 31.80 kg/m   BP, weight, and urine reviewed.  Refer to obstetrical flow sheet for FH & FHR.  Reports good fm.  Denies regular uc's, lof, vb, or uti s/s. Occ pain in epigastric area like somebody was pushing w/ fist. Some heartburn, hasn't taken any meds. Can try tums.  SVE per request: 3.5/50/-2, vtx Reviewed labor s/s, fkc. Plan:  Continue routine obstetrical care  F/U in 1wk for OB appointment

## 2016-12-15 ENCOUNTER — Telehealth: Payer: Self-pay | Admitting: *Deleted

## 2016-12-15 NOTE — Telephone Encounter (Signed)
Patient called stating she has not gotten her TDAP shot and wants to make sure it's not too late. Advised patient that it would be fine for her to still get it. Pt verbalized understanding.

## 2016-12-20 ENCOUNTER — Telehealth: Payer: Self-pay | Admitting: *Deleted

## 2016-12-20 ENCOUNTER — Encounter: Payer: Self-pay | Admitting: Women's Health

## 2016-12-20 ENCOUNTER — Ambulatory Visit (INDEPENDENT_AMBULATORY_CARE_PROVIDER_SITE_OTHER): Payer: Medicaid Other | Admitting: Women's Health

## 2016-12-20 VITALS — BP 106/60 | HR 94 | Wt 199.0 lb

## 2016-12-20 DIAGNOSIS — Z3483 Encounter for supervision of other normal pregnancy, third trimester: Secondary | ICD-10-CM | POA: Diagnosis not present

## 2016-12-20 DIAGNOSIS — Z3A39 39 weeks gestation of pregnancy: Secondary | ICD-10-CM

## 2016-12-20 DIAGNOSIS — Z331 Pregnant state, incidental: Secondary | ICD-10-CM

## 2016-12-20 DIAGNOSIS — Z1389 Encounter for screening for other disorder: Secondary | ICD-10-CM

## 2016-12-20 LAB — POCT URINALYSIS DIPSTICK
Glucose, UA: NEGATIVE
KETONES UA: NEGATIVE
Nitrite, UA: NEGATIVE

## 2016-12-20 NOTE — Patient Instructions (Signed)
Call the office ([phone]) or go to [facility] if:  You begin to have strong, frequent contractions  Your water breaks.  Sometimes it is a big gush of fluid, sometimes it is just a trickle that keeps getting your panties wet or running down your legs  You have vaginal bleeding.  It is normal to have a small amount of spotting if your cervix was checked.   You don't feel your baby moving like normal.  If you don't, get you something to eat and drink and lay down and focus on feeling your baby move.  You should feel at least 10 movements in 2 hours.  If you don't, you should call the office or go to [facility].     Braxton Hicks Contractions Contractions of the uterus can occur throughout pregnancy, but they are not always a sign that you are in labor. You may have practice contractions called Braxton Hicks contractions. These false labor contractions are sometimes confused with true labor. What are Braxton Hicks contractions? Braxton Hicks contractions are tightening movements that occur in the muscles of the uterus before labor. Unlike true labor contractions, these contractions do not result in opening (dilation) and thinning of the cervix. Toward the end of pregnancy (32-34 weeks), Braxton Hicks contractions can happen more often and may become stronger. These contractions are sometimes difficult to tell apart from true labor because they can be very uncomfortable. You should not feel embarrassed if you go to the hospital with false labor. Sometimes, the only way to tell if you are in true labor is for your health care provider to look for changes in the cervix. The health care provider will do a physical exam and may monitor your contractions. If you are not in true labor, the exam should show that your cervix is not dilating and your water has not broken. If there are no prenatal problems or other health problems associated with your pregnancy, it is completely safe for you to be sent  home with false labor. You may continue to have Braxton Hicks contractions until you go into true labor. How can I tell the difference between true labor and false labor?  Differences ? False labor ? Contractions last 30-70 seconds.: Contractions are usually shorter and not as strong as true labor contractions. ? Contractions become very regular.: Contractions are usually irregular. ? Discomfort is usually felt in the top of the uterus, and it spreads to the lower abdomen and low back.: Contractions are often felt in the front of the lower abdomen and in the groin. ? Contractions do not go away with walking.: Contractions may go away when you walk around or change positions while lying down. ? Contractions usually become more intense and increase in frequency.: Contractions get weaker and are shorter-lasting as time goes on. ? The cervix dilates and gets thinner.: The cervix usually does not dilate or become thin. Follow these instructions at home:  Take over-the-counter and prescription medicines only as told by your health care provider.  Keep up with your usual exercises and follow other instructions from your health care provider.  Eat and drink lightly if you think you are going into labor.  If Braxton Hicks contractions are making you uncomfortable: ? Change your position from lying down or resting to walking, or change from walking to resting. ? Sit and rest in a tub of warm water. ? Drink enough fluid to keep your urine clear or pale yellow. Dehydration may cause these contractions. ?   Do slow and deep breathing several times an hour.  Keep all follow-up prenatal visits as told by your health care provider. This is important. Contact a health care provider if:  You have a fever.  You have continuous pain in your abdomen. Get help right away if:  Your contractions become stronger, more regular, and closer together.  You have fluid leaking or gushing from your vagina.  You  pass blood-tinged mucus (bloody show).  You have bleeding from your vagina.  You have low back pain that you never had before.  You feel your baby's head pushing down and causing pelvic pressure.  Your baby is not moving inside you as much as it used to. Summary  Contractions that occur before labor are called Braxton Hicks contractions, false labor, or practice contractions.  Braxton Hicks contractions are usually shorter, weaker, farther apart, and less regular than true labor contractions. True labor contractions usually become progressively stronger and regular and they become more frequent.  Manage discomfort from Braxton Hicks contractions by changing position, resting in a warm bath, drinking plenty of water, or practicing deep breathing. This information is not intended to replace advice given to you by your health care provider. Make sure you discuss any questions you have with your health care provider. Document Released: 09/27/2005 Document Revised: 08/16/2016 Document Reviewed: 08/16/2016 Elsevier Interactive Patient Education  2017 [employer]  

## 2016-12-20 NOTE — Telephone Encounter (Signed)
Patient called stating she has been having mild cramping, back pain, nausea and vomiting throughout the night. Wanting to know if it's normal. Advised patient that since she is 39 weeks, she could go into labor any day now and it could be the early stage of labor. Cramping/ contractions need to be at least 5 minutes a part for a couple of hours and more intense. Advised [facility] to go to [facility] if she started bleeding, leaking or having more intense, frequent contractions.  Pt verbalized understanding.

## 2016-12-20 NOTE — Progress Notes (Signed)
Low-risk OB appointment G2P1001 [redacted]w[redacted]d Estimated Date of Delivery: 12/26/16 BP 106/60   Pulse 94   Wt 199 lb (90.3 kg)   LMP 12/01/2015   BMI 32.12 kg/m   BP, weight, and urine reviewed.  Refer to obstetrical flow sheet for FH & FHR.  Reports good fm.  Denies regular uc's, lof, vb, or uti s/s. Contractions during night, vomited x 1 this am, uc's have spaced out.  SVE per request: 4/50/-2, vtx Reviewed labor s/s, fkc. Plan:  Continue routine obstetrical care  F/U in 1wk for OB appointment

## 2016-12-24 ENCOUNTER — Encounter (HOSPITAL_COMMUNITY): Payer: Self-pay | Admitting: *Deleted

## 2016-12-24 ENCOUNTER — Inpatient Hospital Stay (HOSPITAL_COMMUNITY)
Admission: AD | Admit: 2016-12-24 | Discharge: 2016-12-28 | DRG: 765 | Disposition: A | Discharge status: Home / Self Care | Payer: Medicaid Other | Source: Ambulatory Visit | Attending: Obstetrics & Gynecology | Admitting: Obstetrics & Gynecology

## 2016-12-24 ENCOUNTER — Inpatient Hospital Stay (HOSPITAL_COMMUNITY)
Admission: AD | Admit: 2016-12-24 | Discharge: 2016-12-24 | Disposition: A | Discharge status: Home / Self Care | Payer: Medicaid Other | Source: Ambulatory Visit | Attending: Family Medicine | Admitting: Family Medicine

## 2016-12-24 DIAGNOSIS — Z98891 History of uterine scar from previous surgery: Secondary | ICD-10-CM

## 2016-12-24 DIAGNOSIS — O99824 Streptococcus B carrier state complicating childbirth: Secondary | ICD-10-CM | POA: Diagnosis present

## 2016-12-24 DIAGNOSIS — O471 False labor at or after 37 completed weeks of gestation: Secondary | ICD-10-CM

## 2016-12-24 DIAGNOSIS — O864 Pyrexia of unknown origin following delivery: Secondary | ICD-10-CM | POA: Diagnosis not present

## 2016-12-24 DIAGNOSIS — O321XX0 Maternal care for breech presentation, not applicable or unspecified: Principal | ICD-10-CM | POA: Diagnosis present

## 2016-12-24 DIAGNOSIS — Z3A39 39 weeks gestation of pregnancy: Secondary | ICD-10-CM

## 2016-12-24 DIAGNOSIS — Z833 Family history of diabetes mellitus: Secondary | ICD-10-CM

## 2016-12-24 NOTE — Discharge Instructions (Signed)

## 2016-12-24 NOTE — MAU Note (Signed)
I have communicated with K.Shaw,CNM and reviewed vital signs:  Vitals:   12/24/16 1812  BP: 118/73  Pulse: (!) 101  Resp: 16  Temp: 98.8 F (37.1 C)    Vaginal exam:  Dilation: 4.5 Effacement (%): 60 Station: -2 Presentation: Vertex Exam by:: K.Camri Molloy,RN,   Also reviewed contraction pattern and that non-stress test is reactive.  It has been documented that patient is contracting every 3-5 minutes with minimal cervical change over 1.5 hours not indicating active labor.  Patient denies any other complaints.  Based on this report provider has given order for discharge.  A discharge order and diagnosis entered by a provider.   Labor discharge instructions reviewed with patient.

## 2016-12-24 NOTE — MAU Note (Signed)
Pt sent home earlier . Contractions stronger and closer now.

## 2016-12-25 ENCOUNTER — Inpatient Hospital Stay (HOSPITAL_COMMUNITY): Payer: Medicaid Other | Admitting: Anesthesiology

## 2016-12-25 ENCOUNTER — Encounter (HOSPITAL_COMMUNITY)
Admission: AD | Disposition: A | Discharge status: Home / Self Care | Payer: Self-pay | Source: Ambulatory Visit | Attending: Obstetrics & Gynecology

## 2016-12-25 ENCOUNTER — Encounter (HOSPITAL_COMMUNITY): Payer: Self-pay | Admitting: *Deleted

## 2016-12-25 DIAGNOSIS — O321XX0 Maternal care for breech presentation, not applicable or unspecified: Secondary | ICD-10-CM | POA: Diagnosis present

## 2016-12-25 DIAGNOSIS — O99824 Streptococcus B carrier state complicating childbirth: Secondary | ICD-10-CM | POA: Diagnosis present

## 2016-12-25 DIAGNOSIS — O864 Pyrexia of unknown origin following delivery: Secondary | ICD-10-CM | POA: Diagnosis not present

## 2016-12-25 DIAGNOSIS — O328XX0 Maternal care for other malpresentation of fetus, not applicable or unspecified: Secondary | ICD-10-CM | POA: Diagnosis not present

## 2016-12-25 DIAGNOSIS — Z3A39 39 weeks gestation of pregnancy: Secondary | ICD-10-CM

## 2016-12-25 DIAGNOSIS — Z833 Family history of diabetes mellitus: Secondary | ICD-10-CM | POA: Diagnosis not present

## 2016-12-25 DIAGNOSIS — Z3493 Encounter for supervision of normal pregnancy, unspecified, third trimester: Secondary | ICD-10-CM | POA: Diagnosis present

## 2016-12-25 LAB — CBC
HEMATOCRIT: 38.6 % (ref 36.0–46.0)
Hemoglobin: 12.8 g/dL (ref 12.0–15.0)
MCH: 30.2 pg (ref 26.0–34.0)
MCHC: 33.2 g/dL (ref 30.0–36.0)
MCV: 91 fL (ref 78.0–100.0)
PLATELETS: 382 10*3/uL (ref 150–400)
RBC: 4.24 MIL/uL (ref 3.87–5.11)
RDW: 14.4 % (ref 11.5–15.5)
WBC: 17.8 10*3/uL — ABNORMAL HIGH (ref 4.0–10.5)

## 2016-12-25 LAB — TYPE AND SCREEN
ABO/RH(D): A POS
Antibody Screen: NEGATIVE

## 2016-12-25 LAB — ABO/RH: ABO/RH(D): A POS

## 2016-12-25 LAB — RPR: RPR Ser Ql: NONREACTIVE

## 2016-12-25 SURGERY — Surgical Case
Anesthesia: Spinal

## 2016-12-25 MED ORDER — LACTATED RINGERS IV SOLN: 500.0000 mL | Freq: Once | INTRAVENOUS | Status: DC

## 2016-12-25 MED ORDER — FENTANYL CITRATE (PF) 100 MCG/2ML IJ SOLN: 25.0000 ug | INTRAMUSCULAR | Status: DC | PRN

## 2016-12-25 MED ORDER — OXYCODONE HCL 5 MG PO TABS
10.0000 mg | ORAL_TABLET | ORAL | Status: DC | PRN
  Administered 2016-12-26 – 2016-12-28 (×9): 10 mg via ORAL
  Filled 2016-12-25 (×10): qty 2

## 2016-12-25 MED ORDER — LACTATED RINGERS IV SOLN
INTRAVENOUS | Status: DC | PRN
Start: 2016-12-25 — End: 2016-12-25
  Administered 2016-12-25: 14:00:00 via INTRAVENOUS

## 2016-12-25 MED ORDER — DIPHENHYDRAMINE HCL 25 MG PO CAPS: 25.0000 mg | ORAL_CAPSULE | ORAL | Status: DC | PRN

## 2016-12-25 MED ORDER — SIMETHICONE 80 MG PO CHEW
80.0000 mg | CHEWABLE_TABLET | ORAL | Status: DC | PRN
  Administered 2016-12-28: 80 mg via ORAL

## 2016-12-25 MED ORDER — SIMETHICONE 80 MG PO CHEW
80.0000 mg | CHEWABLE_TABLET | Freq: Three times a day (TID) | ORAL | Status: DC
  Administered 2016-12-26 – 2016-12-28 (×5): 80 mg via ORAL
  Filled 2016-12-25 (×6): qty 1

## 2016-12-25 MED ORDER — OXYCODONE HCL 5 MG PO TABS
5.0000 mg | ORAL_TABLET | ORAL | Status: DC | PRN
  Administered 2016-12-26 – 2016-12-27 (×2): 5 mg via ORAL
  Filled 2016-12-25 (×2): qty 1

## 2016-12-25 MED ORDER — CEFAZOLIN SODIUM-DEXTROSE 2-3 GM-% IV SOLR
INTRAVENOUS | Status: DC | PRN
  Administered 2016-12-25: 2 g via INTRAVENOUS

## 2016-12-25 MED ORDER — SCOPOLAMINE 1 MG/3DAYS TD PT72
MEDICATED_PATCH | TRANSDERMAL | Status: DC | PRN
  Administered 2016-12-25: 1 via TRANSDERMAL

## 2016-12-25 MED ORDER — ZOLPIDEM TARTRATE 5 MG PO TABS: 5.0000 mg | ORAL_TABLET | Freq: Every evening | ORAL | Status: DC | PRN

## 2016-12-25 MED ORDER — METOCLOPRAMIDE HCL 5 MG/ML IJ SOLN: 10.0000 mg | Freq: Once | INTRAMUSCULAR | Status: DC | PRN

## 2016-12-25 MED ORDER — DIPHENHYDRAMINE HCL 25 MG PO CAPS: 25.0000 mg | ORAL_CAPSULE | Freq: Four times a day (QID) | ORAL | Status: DC | PRN

## 2016-12-25 MED ORDER — LACTATED RINGERS IV SOLN: INTRAVENOUS | Status: DC

## 2016-12-25 MED ORDER — IBUPROFEN 600 MG PO TABS
600.0000 mg | ORAL_TABLET | Freq: Four times a day (QID) | ORAL | Status: DC
  Administered 2016-12-25 – 2016-12-28 (×11): 600 mg via ORAL
  Filled 2016-12-25 (×11): qty 1

## 2016-12-25 MED ORDER — ONDANSETRON HCL 4 MG/2ML IJ SOLN
INTRAMUSCULAR | Status: DC | PRN
  Administered 2016-12-25: 4 mg via INTRAVENOUS

## 2016-12-25 MED ORDER — TETANUS-DIPHTH-ACELL PERTUSSIS 5-2.5-18.5 LF-MCG/0.5 IM SUSP
0.5000 mL | Freq: Once | INTRAMUSCULAR | Status: AC
  Administered 2016-12-28: 0.5 mL via INTRAMUSCULAR
  Filled 2016-12-25: qty 0.5

## 2016-12-25 MED ORDER — SCOPOLAMINE 1 MG/3DAYS TD PT72: 1.0000 | MEDICATED_PATCH | Freq: Once | TRANSDERMAL | Status: DC

## 2016-12-25 MED ORDER — FLEET ENEMA 7-19 GM/118ML RE ENEM: 1.0000 | ENEMA | RECTAL | Status: DC | PRN

## 2016-12-25 MED ORDER — TERBUTALINE SULFATE 1 MG/ML IJ SOLN
INTRAMUSCULAR | Status: AC
  Administered 2016-12-25: 0.25 mg
  Filled 2016-12-25: qty 1

## 2016-12-25 MED ORDER — DIPHENHYDRAMINE HCL 50 MG/ML IJ SOLN
6.2500 mg | Freq: Once | INTRAMUSCULAR | Status: AC
  Administered 2016-12-25: 6.5 mg via INTRAVENOUS

## 2016-12-25 MED ORDER — BUPIVACAINE HCL (PF) 0.5 % IJ SOLN
INTRAMUSCULAR | Status: AC
Start: 2016-12-25 — End: 2016-12-25
  Filled 2016-12-25: qty 30

## 2016-12-25 MED ORDER — EPHEDRINE 5 MG/ML INJ: 10.0000 mg | INTRAVENOUS | Status: DC | PRN

## 2016-12-25 MED ORDER — KETOROLAC TROMETHAMINE 30 MG/ML IJ SOLN
30.0000 mg | Freq: Once | INTRAMUSCULAR | Status: AC | PRN
  Administered 2016-12-25: 30 mg via INTRAVENOUS

## 2016-12-25 MED ORDER — FENTANYL CITRATE (PF) 100 MCG/2ML IJ SOLN
INTRAMUSCULAR | Status: DC | PRN
  Administered 2016-12-25: 40 ug via INTRAVENOUS
  Administered 2016-12-25: 10 ug via INTRATHECAL
  Administered 2016-12-25: 25 ug via INTRAVENOUS

## 2016-12-25 MED ORDER — SIMETHICONE 80 MG PO CHEW
80.0000 mg | CHEWABLE_TABLET | ORAL | Status: DC
  Administered 2016-12-25 – 2016-12-28 (×3): 80 mg via ORAL
  Filled 2016-12-25 (×3): qty 1

## 2016-12-25 MED ORDER — ONDANSETRON HCL 4 MG/2ML IJ SOLN
4.0000 mg | Freq: Four times a day (QID) | INTRAMUSCULAR | Status: DC | PRN
  Administered 2016-12-25: 4 mg via INTRAVENOUS
  Filled 2016-12-25: qty 2

## 2016-12-25 MED ORDER — DIPHENHYDRAMINE HCL 50 MG/ML IJ SOLN: 12.5000 mg | INTRAMUSCULAR | Status: DC | PRN

## 2016-12-25 MED ORDER — NALBUPHINE HCL 10 MG/ML IJ SOLN
5.0000 mg | INTRAMUSCULAR | Status: DC | PRN
  Administered 2016-12-25: 5 mg via SUBCUTANEOUS
  Filled 2016-12-25: qty 1

## 2016-12-25 MED ORDER — DIBUCAINE 1 % RE OINT: 1.0000 "application " | TOPICAL_OINTMENT | RECTAL | Status: DC | PRN

## 2016-12-25 MED ORDER — PENICILLIN G POT IN DEXTROSE 60000 UNIT/ML IV SOLN
3.0000 10*6.[IU] | INTRAVENOUS | Status: DC
  Administered 2016-12-25 (×2): 3 10*6.[IU] via INTRAVENOUS
  Filled 2016-12-25 (×6): qty 50

## 2016-12-25 MED ORDER — WITCH HAZEL-GLYCERIN EX PADS: 1.0000 "application " | MEDICATED_PAD | CUTANEOUS | Status: DC | PRN

## 2016-12-25 MED ORDER — BUPIVACAINE IN DEXTROSE 0.75-8.25 % IT SOLN
INTRATHECAL | Status: DC | PRN
  Administered 2016-12-25: 1.6 mL via INTRATHECAL

## 2016-12-25 MED ORDER — MORPHINE SULFATE (PF) 0.5 MG/ML IJ SOLN
INTRAMUSCULAR | Status: AC
  Filled 2016-12-25: qty 10

## 2016-12-25 MED ORDER — ACETAMINOPHEN 325 MG PO TABS
650.0000 mg | ORAL_TABLET | ORAL | Status: DC | PRN
  Administered 2016-12-25 – 2016-12-26 (×2): 650 mg via ORAL
  Filled 2016-12-25 (×2): qty 2

## 2016-12-25 MED ORDER — NALOXONE HCL 0.4 MG/ML IJ SOLN: 0.4000 mg | INTRAMUSCULAR | Status: DC | PRN

## 2016-12-25 MED ORDER — FENTANYL CITRATE (PF) 100 MCG/2ML IJ SOLN
INTRAMUSCULAR | Status: AC
  Filled 2016-12-25: qty 2

## 2016-12-25 MED ORDER — PHENYLEPHRINE 40 MCG/ML (10ML) SYRINGE FOR IV PUSH (FOR BLOOD PRESSURE SUPPORT): 80.0000 ug | PREFILLED_SYRINGE | INTRAVENOUS | Status: DC | PRN

## 2016-12-25 MED ORDER — PHENYLEPHRINE 8 MG IN D5W 100 ML (0.08MG/ML) PREMIX OPTIME
INJECTION | INTRAVENOUS | Status: DC | PRN
  Administered 2016-12-25: 60 ug/min via INTRAVENOUS

## 2016-12-25 MED ORDER — KETOROLAC TROMETHAMINE 30 MG/ML IJ SOLN: 30.0000 mg | Freq: Four times a day (QID) | INTRAMUSCULAR | Status: DC | PRN

## 2016-12-25 MED ORDER — NALOXONE HCL 2 MG/2ML IJ SOSY: 1.0000 ug/kg/h | PREFILLED_SYRINGE | INTRAVENOUS | Status: DC | PRN

## 2016-12-25 MED ORDER — ONDANSETRON HCL 4 MG/2ML IJ SOLN: 4.0000 mg | Freq: Three times a day (TID) | INTRAMUSCULAR | Status: DC | PRN

## 2016-12-25 MED ORDER — DEXAMETHASONE SODIUM PHOSPHATE 10 MG/ML IJ SOLN
INTRAMUSCULAR | Status: DC | PRN
  Administered 2016-12-25: 4 mg via INTRAVENOUS

## 2016-12-25 MED ORDER — COCONUT OIL OIL
1.0000 "application " | TOPICAL_OIL | Status: DC | PRN
  Administered 2016-12-27: 1 via TOPICAL
  Filled 2016-12-25: qty 120

## 2016-12-25 MED ORDER — SODIUM CHLORIDE 0.9% FLUSH: 3.0000 mL | INTRAVENOUS | Status: DC | PRN

## 2016-12-25 MED ORDER — PHENYLEPHRINE 8 MG IN D5W 100 ML (0.08MG/ML) PREMIX OPTIME
INJECTION | INTRAVENOUS | Status: AC
  Filled 2016-12-25: qty 100

## 2016-12-25 MED ORDER — OXYTOCIN 10 UNIT/ML IJ SOLN
INTRAMUSCULAR | Status: AC
  Filled 2016-12-25: qty 4

## 2016-12-25 MED ORDER — NALBUPHINE HCL 10 MG/ML IJ SOLN: 5.0000 mg | INTRAMUSCULAR | Status: DC | PRN

## 2016-12-25 MED ORDER — PENICILLIN G POTASSIUM 5000000 UNITS IJ SOLR
5.0000 10*6.[IU] | Freq: Once | INTRAVENOUS | Status: AC
  Administered 2016-12-25: 5 10*6.[IU] via INTRAVENOUS
  Filled 2016-12-25: qty 5

## 2016-12-25 MED ORDER — MEPERIDINE HCL 25 MG/ML IJ SOLN: 6.2500 mg | INTRAMUSCULAR | Status: DC | PRN

## 2016-12-25 MED ORDER — DEXAMETHASONE SODIUM PHOSPHATE 4 MG/ML IJ SOLN
INTRAMUSCULAR | Status: AC
  Filled 2016-12-25: qty 1

## 2016-12-25 MED ORDER — TERBUTALINE SULFATE 1 MG/ML IJ SOLN: 0.2500 mg | Freq: Once | INTRAMUSCULAR | Status: DC

## 2016-12-25 MED ORDER — PRENATAL MULTIVITAMIN CH
1.0000 | ORAL_TABLET | Freq: Every day | ORAL | Status: DC
  Administered 2016-12-26 – 2016-12-28 (×3): 1 via ORAL
  Filled 2016-12-25 (×3): qty 1

## 2016-12-25 MED ORDER — NALBUPHINE HCL 10 MG/ML IJ SOLN: 5.0000 mg | Freq: Once | INTRAMUSCULAR | Status: DC | PRN

## 2016-12-25 MED ORDER — DIPHENHYDRAMINE HCL 50 MG/ML IJ SOLN
INTRAMUSCULAR | Status: AC
  Administered 2016-12-25: 6.5 mg via INTRAVENOUS
  Filled 2016-12-25: qty 1

## 2016-12-25 MED ORDER — OXYTOCIN 10 UNIT/ML IJ SOLN
INTRAVENOUS | Status: DC | PRN
  Administered 2016-12-25: 40 [IU] via INTRAVENOUS

## 2016-12-25 MED ORDER — OXYTOCIN 40 UNITS IN LACTATED RINGERS INFUSION - SIMPLE MED: 2.5000 [IU]/h | INTRAVENOUS | Status: DC

## 2016-12-25 MED ORDER — ACETAMINOPHEN 325 MG PO TABS: 650.0000 mg | ORAL_TABLET | ORAL | Status: DC | PRN

## 2016-12-25 MED ORDER — OXYTOCIN 40 UNITS IN LACTATED RINGERS INFUSION - SIMPLE MED: 2.5000 [IU]/h | INTRAVENOUS | Status: AC

## 2016-12-25 MED ORDER — LACTATED RINGERS IV SOLN: 500.0000 mL | INTRAVENOUS | Status: DC | PRN

## 2016-12-25 MED ORDER — KETOROLAC TROMETHAMINE 30 MG/ML IJ SOLN
INTRAMUSCULAR | Status: AC
  Administered 2016-12-25: 30 mg via INTRAVENOUS
  Filled 2016-12-25: qty 1

## 2016-12-25 MED ORDER — SOD CITRATE-CITRIC ACID 500-334 MG/5ML PO SOLN
30.0000 mL | ORAL | Status: DC | PRN
  Administered 2016-12-25: 30 mL via ORAL
  Filled 2016-12-25: qty 15

## 2016-12-25 MED ORDER — FENTANYL 2.5 MCG/ML BUPIVACAINE 1/10 % EPIDURAL INFUSION (WH - ANES): 14.0000 mL/h | INTRAMUSCULAR | Status: DC | PRN

## 2016-12-25 MED ORDER — LIDOCAINE HCL (PF) 1 % IJ SOLN: 30.0000 mL | INTRAMUSCULAR | Status: DC | PRN

## 2016-12-25 MED ORDER — LACTATED RINGERS IV SOLN
INTRAVENOUS | Status: DC
  Administered 2016-12-25: 09:00:00 via INTRAVENOUS

## 2016-12-25 MED ORDER — SENNOSIDES-DOCUSATE SODIUM 8.6-50 MG PO TABS
2.0000 | ORAL_TABLET | ORAL | Status: DC
  Administered 2016-12-25 – 2016-12-28 (×3): 2 via ORAL
  Filled 2016-12-25 (×3): qty 2

## 2016-12-25 MED ORDER — LACTATED RINGERS IV SOLN
INTRAVENOUS | Status: DC | PRN
Start: 2016-12-25 — End: 2016-12-25
  Administered 2016-12-25 (×2): via INTRAVENOUS

## 2016-12-25 MED ORDER — MORPHINE SULFATE (PF) 0.5 MG/ML IJ SOLN
INTRAMUSCULAR | Status: DC | PRN
  Administered 2016-12-25: .2 mg via INTRATHECAL

## 2016-12-25 MED ORDER — OXYTOCIN BOLUS FROM INFUSION: 500.0000 mL | Freq: Once | INTRAVENOUS | Status: DC

## 2016-12-25 MED ORDER — FENTANYL CITRATE (PF) 100 MCG/2ML IJ SOLN
50.0000 ug | INTRAMUSCULAR | Status: DC | PRN
  Administered 2016-12-25 (×4): 100 ug via INTRAVENOUS
  Filled 2016-12-25 (×4): qty 2

## 2016-12-25 MED ORDER — MENTHOL 3 MG MT LOZG
1.0000 | LOZENGE | OROMUCOSAL | Status: DC | PRN
  Administered 2016-12-26: 3 mg via ORAL
  Filled 2016-12-25: qty 9

## 2016-12-25 MED ORDER — SCOPOLAMINE 1 MG/3DAYS TD PT72
MEDICATED_PATCH | TRANSDERMAL | Status: AC
  Filled 2016-12-25: qty 1

## 2016-12-25 SURGICAL SUPPLY — 41 items
APL SKNCLS STERI-STRIP NONHPOA (GAUZE/BANDAGES/DRESSINGS) ×1
BENZOIN TINCTURE PRP APPL 2/3 (GAUZE/BANDAGES/DRESSINGS) ×3 IMPLANT
BLADE TIP J-PLASMA PRECISE LAP (MISCELLANEOUS) ×3 IMPLANT
CHLORAPREP W/TINT 26ML (MISCELLANEOUS) ×3 IMPLANT
CLAMP CORD UMBIL (MISCELLANEOUS) IMPLANT
CLOSURE STERI STRIP 1/2 X4 (GAUZE/BANDAGES/DRESSINGS) ×1 IMPLANT
CLOSURE WOUND 1/2 X4 (GAUZE/BANDAGES/DRESSINGS) ×1
CLOTH BEACON ORANGE TIMEOUT ST (SAFETY) ×3 IMPLANT
DECANTER SPIKE VIAL GLASS SM (MISCELLANEOUS) ×2 IMPLANT
DRSG OPSITE POSTOP 4X10 (GAUZE/BANDAGES/DRESSINGS) ×3 IMPLANT
ELECT REM PT RETURN 9FT ADLT (ELECTROSURGICAL) ×3
ELECTRODE REM PT RTRN 9FT ADLT (ELECTROSURGICAL) ×1 IMPLANT
EXTRACTOR VACUUM KIWI (MISCELLANEOUS) IMPLANT
GLOVE BIO SURGEON STRL SZ7 (GLOVE) ×3 IMPLANT
GLOVE BIOGEL PI IND STRL 7.0 (GLOVE) ×2 IMPLANT
GLOVE BIOGEL PI INDICATOR 7.0 (GLOVE) ×4
GOWN STRL REUS W/TWL LRG LVL3 (GOWN DISPOSABLE) ×6 IMPLANT
GOWN STRL REUS W/TWL XL LVL3 (GOWN DISPOSABLE) ×3 IMPLANT
KIT ABG SYR 3ML LUER SLIP (SYRINGE) IMPLANT
NDL HYPO 25X5/8 SAFETYGLIDE (NEEDLE) IMPLANT
NEEDLE HYPO 22GX1.5 SAFETY (NEEDLE) ×3 IMPLANT
NEEDLE HYPO 25X5/8 SAFETYGLIDE (NEEDLE) IMPLANT
NS IRRIG 1000ML POUR BTL (IV SOLUTION) ×3 IMPLANT
PACK C SECTION WH (CUSTOM PROCEDURE TRAY) ×3 IMPLANT
PAD ABD 7.5X8 STRL (GAUZE/BANDAGES/DRESSINGS) ×2 IMPLANT
PAD OB MATERNITY 4.3X12.25 (PERSONAL CARE ITEMS) ×3 IMPLANT
PENCIL SMOKE EVAC W/HOLSTER (ELECTROSURGICAL) ×3 IMPLANT
RTRCTR C-SECT PINK 25CM LRG (MISCELLANEOUS) IMPLANT
SPONGE GAUZE 4X4 12PLY STER LF (GAUZE/BANDAGES/DRESSINGS) ×4 IMPLANT
SPONGE SURGIFOAM ABS GEL 12-7 (HEMOSTASIS) IMPLANT
STRIP CLOSURE SKIN 1/2X4 (GAUZE/BANDAGES/DRESSINGS) ×2 IMPLANT
SUT PDS AB 0 CTX 60 (SUTURE) IMPLANT
SUT PLAIN 0 NONE (SUTURE) IMPLANT
SUT SILK 0 TIES 10X30 (SUTURE) IMPLANT
SUT VIC AB 0 CT1 36 (SUTURE) ×9 IMPLANT
SUT VIC AB 3-0 CT1 27 (SUTURE) ×3
SUT VIC AB 3-0 CT1 TAPERPNT 27 (SUTURE) ×1 IMPLANT
SUT VIC AB 4-0 KS 27 (SUTURE) IMPLANT
SYR CONTROL 10ML LL (SYRINGE) ×3 IMPLANT
TOWEL OR 17X24 6PK STRL BLUE (TOWEL DISPOSABLE) ×3 IMPLANT
TRAY FOLEY CATH SILVER 14FR (SET/KITS/TRAYS/PACK) ×3 IMPLANT

## 2016-12-25 NOTE — Consult Note (Signed)
Neonatology Note:   Attendance at C-section:    I was asked by Dr. Ihor Dow to attend this repeat C/S at term for breech. The mother is a G2P1, GBS + with aIAP and good prenatal care. No labor per LD.  Mother with PCOS.  ROM at delivery, fluid clear. Infant vigorous with good spontaneous cry and tone. Needed only minimal bulb suctioning. Ap 8/9. Lungs clear to ausc in DR. To CN to care of Pediatrician.  Monia Sabal Katherina Mires, MD

## 2016-12-25 NOTE — Progress Notes (Signed)
Patient ID: Paula Houston, female   DOB: October 03, 1996, 21 y.o.   MRN: [id number] Pt presented with contractions and labor. She was found to be in breech presentation. She had a failed attempt at an ECV.  We will proceed to cesarean section.  The risks of cesarean section discussed with the patient included but were not limited to: bleeding which may require transfusion or reoperation; infection which may require antibiotics; injury to bowel, bladder, ureters or other surrounding organs; injury to the fetus; need for additional procedures including hysterectomy in the event of a life-threatening hemorrhage; placental abnormalities wth subsequent pregnancies, incisional problems, thromboembolic phenomenon and other postoperative/anesthesia complications. The patient concurred with the proposed plan, giving informed written consent for the procedure.   Patient has been NPO since yesterday she will remain NPO for procedure. Anesthesia and OR aware. Preoperative prophylactic antibiotics and SCDs ordered on call to the OR.  To OR when ready.  Talani Brazee L. Harraway-Smith, M.D., Cherlynn June

## 2016-12-25 NOTE — Anesthesia Procedure Notes (Signed)
Spinal  Patient location during procedure: OB Start time: 12/25/2016 1:59 PM End time: 12/25/2016 2:04 PM Staffing Anesthesiologist: Candida Peeling RAY Performed: anesthesiologist  Preanesthetic Checklist Completed: patient identified, surgical consent, pre-op evaluation, timeout performed, IV checked, risks and benefits discussed and monitors and equipment checked Spinal Block Patient position: sitting Prep: Betadine and site prepped and draped Patient monitoring: heart rate, cardiac monitor, continuous pulse ox and blood pressure Approach: midline Location: L3-4 Injection technique: single-shot Needle Needle type: Pencan  Needle gauge: 25 G Needle length: 10 cm Assessment Sensory level: T4

## 2016-12-25 NOTE — Progress Notes (Signed)
Version unsuccessful. POC discussed with pt and family by DR Kennon Rounds.

## 2016-12-25 NOTE — Anesthesia Postprocedure Evaluation (Signed)
Anesthesia Post Note  Patient: DREAMER CARILLO  Procedure(s) Performed: Procedure(s) (LRB): CESAREAN SECTION (N/A)  Patient location during evaluation: PACU Anesthesia Type: Spinal Level of consciousness: oriented and awake and alert Pain management: pain level controlled Vital Signs Assessment: post-procedure vital signs reviewed and stable Respiratory status: spontaneous breathing and respiratory function stable Cardiovascular status: blood pressure returned to baseline and stable Postop Assessment: no headache and no backache Anesthetic complications: no        Last Vitals:  Vitals:   12/25/16 1621 12/25/16 1622  BP:    Pulse: 88 87  Resp: 18 16  Temp:      Last Pain:  Vitals:   12/25/16 1617  TempSrc:   PainSc: 3    Pain Goal:                 Lynda Rainwater

## 2016-12-25 NOTE — Progress Notes (Signed)
Patient ID: Paula Houston, female   DOB: 1996-05-08, 21 y.o.   MRN: [id number] After informed verbal consent, Terbutaline 0.25 mg SQ given, ECV was attempted under Ultrasound guidance.  Backward roll and forward roll attempted x 4. The head remained in the RUQ and did not budge.   FHR was reactive before and after the procedure.   Pt. Tolerated the procedure well. She has been made NPO.

## 2016-12-25 NOTE — Op Note (Signed)
Paula Houston PROCEDURE DATE: 12/24/2016 - 12/25/2016  PREOPERATIVE DIAGNOSIS: Intrauterine pregnancy at  [redacted]w[redacted]d weeks gestation; malpresentation: complete breech  POSTOPERATIVE DIAGNOSIS: The same  PROCEDURE: Primary Low Transverse Cesarean Section  SURGEON:  Dr. Hoyle Sauer L. Harraway-Smith                      Dr. Jacquiline Doe   ASSISTANT:  None  Complications: none immediate   INDICATIONS: Paula Houston is a 21 y.o. [id number] at [redacted]w[redacted]d here for cesarean section secondary to the indications listed under preoperative diagnosis; please see preoperative note for further details.  The risks of cesarean section were discussed with the patient including but were not limited to: bleeding which may require transfusion or reoperation; infection which may require antibiotics; injury to bowel, bladder, ureters or other surrounding organs; injury to the fetus; need for additional procedures including hysterectomy in the event of a life-threatening hemorrhage; placental abnormalities wth subsequent pregnancies, incisional problems, thromboembolic phenomenon and other postoperative/anesthesia complications.   The patient concurred with the proposed plan, giving informed written consent for the procedure.    FINDINGS:  Viable female infant in cephalic presentation.  Apgars pending.  Clear amniotic fluid.  Intact placenta, three vessel cord.  Normal uterus, fallopian tubes and ovaries bilaterally.  PROCEDURE IN DETAIL:  The patient preoperatively received intravenous antibiotics and had sequential compression devices applied to her lower extremities.  She was then taken to the operating room where spinal anesthesia was administered and was found to be adequate. She was then placed in a dorsal supine position with a leftward tilt, and prepped and draped in a sterile manner.  A foley catheter was placed into her bladder and attached to constant gravity.  After an adequate timeout was performed, a  Pfannenstiel skin incision was made with scalpel and carried through to the underlying layer of fascia. The fascia was incised in the midline, and this incision was extended bilaterally using the Mayo scissors.  Kocher clamps were applied to the superior aspect of the fascial incision and the underlying rectus muscles were dissected off bluntly. A similar process was carried out on the inferior aspect of the fascial incision. The rectus muscles were separated in the midline bluntly and the peritoneum was entered bluntly.  Attention was turned to the lower uterine segment where a low transverse hysterotomy incision was made with a scalpel and extended bilaterally bluntly.  The infant was successfully delivered from the breech position, the cord was clamped and cut and the infant was handed over to awaiting neonatology team. The placenta was delivered manually. Uterine massage was then administered.  The placenta was intact with a three-vessel cord. The uterus was then cleared of clot and debris.  The hysterotomy was closed with 0 Vicryl in a running locked fashion, and an imbricating layer was also placed with the same suture. The uterus was returned to the pelvis. The pelvis was cleared of all clot and debris. Hemostasis was confirmed on all surfaces.  The peritoneum and the muscles were reapproximated using 0 Vicryl with 1 interrupted suture. The fascia was then closed using 0 Vicryl.  The subcutaneous layer was irrigated, then reapproximated with 3-0 vicryl in a running locked fashion, and the skin was closed with a 4-0 Vicryl subcuticular stitch.  30 cc of 0.5% marcaine was injected into the incision..  The patient tolerated the procedure well. Sponge, lap, instrument and needle counts were correct x 2.  She was taken to the recovery room  in stable condition.   Jacquiline Doe, MD 12/25/16 1500

## 2016-12-25 NOTE — H&P (Signed)
Paula Houston is a 21 y.o. female G2P1001 with IUP at [redacted]w[redacted]d presenting for SOL. Pt states she has been having regular, every 2-5 minutes contractions, associated with none vaginal bleeding for 8 hours..  Membranes are intact, with active fetal movement.   [facility] at [facility] since 11 wks  Prenatal History/Complications:  Past Medical History: Past Medical History:  Diagnosis Date  . Medical history non-contributory   . Painful menstrual periods 06/04/2014  . PCOS (polycystic ovarian syndrome)     Past Surgical History: Past Surgical History:  Procedure Laterality Date  . WISDOM TOOTH EXTRACTION      Obstetrical History: OB History    Gravida Para Term Preterm AB Living   2 1 1  0 0 1   SAB TAB Ectopic Multiple Live Births   0 0 0 0 1       Social History: Social History   Social History  . Marital status: Married    Spouse name: N/A  . Number of children: N/A  . Years of education: N/A   Social History Main Topics  . Smoking status: Never Smoker  . Smokeless tobacco: Never Used  . Alcohol use No  . Drug use: No  . Sexual activity: Yes    Birth control/ protection: None   Other Topics Concern  . Not on file   Social History Narrative  . No narrative on file    Family History: Family History  Problem Relation Age of Onset  . COPD Paternal Grandmother   . Diabetes Mother   . Diabetes Brother     Allergies: Allergies  Allergen Reactions  . Codeine Nausea And Vomiting    Prescriptions Prior to Admission  Medication Sig Dispense Refill Last Dose  . Prenatal Vit-Fe Fumarate-FA (PRENATAL MULTIVITAMIN) TABS tablet Take 1 tablet by mouth daily at 12 noon.   12/23/2016 at Unknown time    Review of Systems   Constitutional: Negative for fever and chills Eyes: Negative for visual disturbances Respiratory: Negative for shortness of breath, dyspnea Cardiovascular: Negative for chest pain or palpitations  Gastrointestinal: Negative for vomiting,  diarrhea and constipation.  POSITIVE for abdominal pain (contractions) Genitourinary: Negative for dysuria and urgency Musculoskeletal: Negative for back pain, joint pain, myalgias  Neurological: Negative for dizziness and headaches   Blood pressure 130/73, pulse (!) 102, temperature 98.2 F (36.8 C), resp. rate 18, last menstrual period 12/01/2015. General appearance: alert, cooperative and no distress Lungs: clear to auscultation bilaterally Heart: regular rate and rhythm Abdomen: soft, non-tender; bowel sounds normal Extremities: Homans sign is negative, no sign of DVT DTR's 2+ Presentation: cephalic Fetal monitoring  Baseline: 130 bpm, Variability: Good {> 6 bpm), Accelerations: Reactive and Decelerations: Absent Uterine activity  Frequency: Every 2-6 minutes and Intensity: moderate Dilation: 4.5 Effacement (%): 50 Station: -3 Exam by:: V.Rogers, SMW   Prenatal labs: ABO, Rh: A/Positive/-- (08/29 1645) Antibody: Negative (12/29 0852) Rubella: Immune  RPR: Non Reactive (12/29 0852)  HBsAg: Negative (08/29 1645)  HIV: Non Reactive (12/29 0852)  GBS:  _Positive   1 hr Glucola 167 Genetic screening  decline Anatomy US normal   Prenatal Transfer Tool  Maternal Diabetes: No Genetic Screening: Declined Maternal Ultrasounds/Referrals: Normal Fetal Ultrasounds or other Referrals:  None Maternal Substance Abuse:  No Significant Maternal Medications:  None Significant Maternal Lab Results: Lab values include: Group B Strep positive   No results found for this or any previous visit (from the past 24 hour(s)).  Assessment: Paula Houston is a 21  y.o. G2P1001 with an IUP at [redacted]w[redacted]d presenting for latent labor  Plan: #Labor: expectant management  #Pain:  Per request #FWB Cat 1 #ID: GBS: positive; PCN #MOF:  breast #MOC: condoms   Darrol Poke BSN, SNM  12/25/2016, 2:40 AM   CNM attestation:  Paula Houston is a 21 y.o. G2P1001 here for latent labor vs  advanced cx dilation at term  PE: BP 128/77   Pulse (!) 124   Temp 97.6 F (36.4 C) (Axillary)   Resp 20   Ht 5\' 6"  (1.676 m)   Wt 90.3 kg (199 lb)   LMP 12/01/2015   SpO2 100%   BMI 32.12 kg/m  Gen: calm comfortable, NAD Resp: normal effort, no distress Abd: gravid  ROS, labs, PMH reviewed  Plan: Admit to [facility] Expectant management while PCN getting on board, then may need to consider augmentation Anticipate SVD  Cruzito Standre, [place] 12/25/2016, 9:35 AM

## 2016-12-25 NOTE — Progress Notes (Addendum)
Patient is seen status post failed version.  Plan will be to proceed with PLTCS for malpresentation.  The risks of cesarean section were discussed with the patient including but were not limited to: bleeding which may require transfusion or reoperation; infection which may require antibiotics; injury to bowel, bladder, ureters or other surrounding organs; injury to the fetus; need for additional procedures including hysterectomy in the event of a life-threatening hemorrhage; placental abnormalities wth subsequent pregnancies, incisional problems, thromboembolic phenomenon and other postoperative/anesthesia complications.  Patient has been NPO.  She will remain NPO for procedure. Anesthesia and OR aware.  Preoperative prophylactic antibiotics and SCDs ordered on call to the OR.  To OR when ready.   Jacquiline Doe, MD 12/25/16 1130

## 2016-12-25 NOTE — Transfer of Care (Signed)
Immediate Anesthesia Transfer of Care Note  Patient: Paula Houston  Procedure(s) Performed: Procedure(s): CESAREAN SECTION (N/A)  Patient Location: PACU  Anesthesia Type:Spinal  Level of Consciousness: awake, alert  and oriented  Airway & Oxygen Therapy: Patient Spontanous Breathing  Post-op Assessment: Report given to RN and Post -op Vital signs reviewed and stable  Post vital signs: Reviewed and stable  Last Vitals:  Vitals:   12/25/16 1258 12/25/16 1333  BP: 118/67   Pulse: (!) 116   Resp: 18 20  Temp:      Last Pain:  Vitals:   12/25/16 1254  TempSrc: Axillary  PainSc: 6          Complications: No apparent anesthesia complications

## 2016-12-25 NOTE — Anesthesia Preprocedure Evaluation (Signed)
Anesthesia Evaluation  Patient identified by MRN, date of birth, ID band Patient awake    Reviewed: Allergy & Precautions, NPO status , Patient's Chart, lab work & pertinent test results  Airway Mallampati: II  TM Distance: >3 FB Neck ROM: Full    Dental no notable dental hx.    Pulmonary neg pulmonary ROS,    Pulmonary exam normal breath sounds clear to auscultation       Cardiovascular negative cardio ROS Normal cardiovascular exam Rhythm:Regular Rate:Normal     Neuro/Psych negative neurological ROS  negative psych ROS   GI/Hepatic negative GI ROS, Neg liver ROS,   Endo/Other  negative endocrine ROS  Renal/GU negative Renal ROS  negative genitourinary   Musculoskeletal negative musculoskeletal ROS (+)   Abdominal   Peds negative pediatric ROS (+)  Hematology negative hematology ROS (+)   Anesthesia Other Findings   Reproductive/Obstetrics negative OB ROS (+) Pregnancy                             Anesthesia Physical Anesthesia Plan  ASA: II  Anesthesia Plan: Spinal   Post-op Pain Management:    Induction:   Airway Management Planned: Natural Airway  Additional Equipment:   Intra-op Plan:   Post-operative Plan:   Informed Consent:   Plan Discussed with:   Anesthesia Plan Comments:         Anesthesia Quick Evaluation

## 2016-12-25 NOTE — Progress Notes (Signed)
New gown and peripad placed. Pt given benadryl for itching.

## 2016-12-26 ENCOUNTER — Encounter (HOSPITAL_COMMUNITY): Payer: Self-pay | Admitting: Obstetrics & Gynecology

## 2016-12-26 LAB — CBC
HEMATOCRIT: 30.1 % — AB (ref 36.0–46.0)
HEMOGLOBIN: 10.3 g/dL — AB (ref 12.0–15.0)
MCH: 31.6 pg (ref 26.0–34.0)
MCHC: 34.2 g/dL (ref 30.0–36.0)
MCV: 92.3 fL (ref 78.0–100.0)
Platelets: 328 10*3/uL (ref 150–400)
RBC: 3.26 MIL/uL — ABNORMAL LOW (ref 3.87–5.11)
RDW: 14.3 % (ref 11.5–15.5)
WBC: 19.3 10*3/uL — ABNORMAL HIGH (ref 4.0–10.5)

## 2016-12-26 NOTE — Lactation Note (Addendum)
This note was copied from a baby's chart. Lactation Consultation Note Initial visit at 51 hours of age.  Mom reports baby has fed well, but then has been sleepy.  Last feeding ~ 6 hours ago.  Mom reports history of PCOS and irregular cycles.  Mom reports + breast changes during pregnancy, and breasts did get full with older child although difficulty latching only a few days in hospital. Mom is able to hand express a few drops of colostrum. LC placed baby STS, Baby is awake and quiet alert, but not eager to suck.  Previous LATCH score of "8" with 3 voids and 5 stools.  Baby remains STS with mom.  [facility] LC resources given and discussed.  Encouraged to feed with early cues on demand. Mom to wake baby for STS as needed and attempt hand expression to offer spoon feeding as needed.   Early newborn behavior discussed.  Mom to call for assist as needed.       Patient Name: Paula Houston Date: 12/26/2016 Reason for consult: Initial assessment   Maternal Data Has patient been taught Hand Expression?: Yes Does the patient have breastfeeding experience prior to this delivery?: Yes  Feeding Feeding Type: Breast Fed  LATCH Score/Interventions Latch: Too sleepy or reluctant, no latch achieved, no sucking elicited. Intervention(s): Skin to skin;Teach feeding cues;Waking techniques  Audible Swallowing: None  Type of Nipple: Everted at rest and after stimulation (semi flat compressible)  Comfort (Breast/Nipple): Soft / non-tender     Hold (Positioning): Assistance needed to correctly position infant at breast and maintain latch. Intervention(s): Breastfeeding basics reviewed;Support Pillows;Position options  LATCH Score: 5  Lactation Tools Discussed/Used WIC Program: No   Consult Status Consult Status: Follow-up Date: 12/27/16 Follow-up type: In-patient    Paula Houston 12/26/2016, 5:39 PM

## 2016-12-26 NOTE — Progress Notes (Signed)
POSTPARTUM PROGRESS NOTE  Post Operative Day 1 Subjective:  Paula Houston is a 21 y.o. [id number] [redacted]w[redacted]d s/p pLTCS.  No acute events overnight.  Pt denies problems with ambulating or po intake.  She has not voided yet; catheter was just removed.  She denies nausea or vomiting.  Pain is well controlled.  She has had flatus. She has not had bowel movement.  Lochia Minimal.   Objective: Blood pressure (!) 87/44, pulse 75, temperature 98.4 F (36.9 C), resp. rate 20, height 5\' 6"  (1.676 m), weight 199 lb (90.3 kg), last menstrual period 12/01/2015, SpO2 97 %, unknown if currently breastfeeding.  Physical Exam:  General: alert, cooperative and no distress Lochia:normal flow Chest: no respiratory distress Heart:regular rate, distal pulses intact Incision: dressing clean, dry, and intact Abdomen: soft, nontender Uterine Fundus: firm, appropriately tender DVT Evaluation: No calf swelling or tenderness Extremities: no edema   Recent Labs  12/25/16 0145 12/26/16 0523  HGB 12.8 10.3*  HCT 38.6 30.1*    Assessment/Plan:  ASSESSMENT: Paula Houston is a 21 y.o. [id number] [redacted]w[redacted]d s/p pLTCS  Breastfeeding Contraception: condoms Plan for discharge 1-2 days   LOS: 1 day   Gregary Signs RhodenMD 12/26/2016, 7:25 AM   OB FELLOW POSTPARTUM PROGRESS NOTE ATTESTATION  I have seen and examined this patient and agree with above documentation in the resident's note.    Paula Houston is 21 y/o G2 now P2 s/p pLTCS at 4 and 6 for breech presentation. No complatins this AM  General: well appearing, NAD Lochia: moderate Abdomen/fundus: at umbilcus, firm DVT Evaluation: negative homans sign Extremities: trace edema  A/P doing well s/p PLTCS. D/C home tomorrow.  Jacquiline Doe, MD 8:39 AM

## 2016-12-27 ENCOUNTER — Encounter: Payer: Medicaid Other | Admitting: Women's Health

## 2016-12-27 LAB — CBC WITH DIFFERENTIAL/PLATELET
BASOS ABS: 0 10*3/uL (ref 0.0–0.1)
BASOS PCT: 0 %
EOS ABS: 0.1 10*3/uL (ref 0.0–0.7)
EOS PCT: 0 %
HCT: 29.4 % — ABNORMAL LOW (ref 36.0–46.0)
Hemoglobin: 9.9 g/dL — ABNORMAL LOW (ref 12.0–15.0)
LYMPHS PCT: 11 %
Lymphs Abs: 2 10*3/uL (ref 0.7–4.0)
MCH: 31.3 pg (ref 26.0–34.0)
MCHC: 33.7 g/dL (ref 30.0–36.0)
MCV: 93 fL (ref 78.0–100.0)
MONO ABS: 1.2 10*3/uL — AB (ref 0.1–1.0)
Monocytes Relative: 7 %
Neutro Abs: 14.7 10*3/uL — ABNORMAL HIGH (ref 1.7–7.7)
Neutrophils Relative %: 82 %
PLATELETS: 277 10*3/uL (ref 150–400)
RBC: 3.16 MIL/uL — AB (ref 3.87–5.11)
RDW: 14.5 % (ref 11.5–15.5)
WBC: 17.9 10*3/uL — AB (ref 4.0–10.5)

## 2016-12-27 NOTE — Progress Notes (Signed)
Subjective: Postpartum Day 2: Cesarean Delivery Patient reports incisional pain, tolerating PO, + flatus and no problems voiding.    Objective: Vital signs in last 24 hours: Temp:  [98.1 F (36.7 C)-101.4 F (38.6 C)] 101.4 F (38.6 C) (03/19 0651) Pulse Rate:  [104-125] 125 (03/19 0651) Resp:  [18-19] 19 (03/19 0651) BP: (101-102)/(47-51) 101/47 (03/19 0651) SpO2:  [99 %] 99 % (03/18 1900)  Physical Exam:  General: alert, cooperative, appears stated age and no distress Lochia: appropriate Uterine Fundus: firm Incision: healing well, no significant drainage, no dehiscence, no significant erythema DVT Evaluation: No evidence of DVT seen on physical exam.   Recent Labs  12/25/16 0145 12/26/16 0523  HGB 12.8 10.3*  HCT 38.6 30.1*    Assessment/Plan: Status post Cesarean section. Doing well postoperatively. Temp 101.4 this morning  Will get CBC with diff and obs closely.  Koren Shiver 12/27/2016, 7:03 AM

## 2016-12-27 NOTE — Lactation Note (Signed)
This note was copied from a baby's chart. Lactation Consultation Note  Patient Name: Paula Houston [id number] Date: 12/27/2016 Reason for consult: Follow-up assessment Baby at 50 hr of life. Mom is reporting bilateral nipple soreness. She has bruising and horizontal compression stripes on the both nipples. She has coconut oil at the bedside, given comfort gels. Parents report baby has been cluster feeding over night. Mom stated latching has been getting better. Offered to help latch baby at this visit and mom declined. She stated baby just ate and she is tired. Discussed baby behavior, feeding frequency, pacifier use, supplementing, baby belly size, voids, wt loss, breast changes, and nipple care. Mom stated she can manually express, has seen colostrum, and has a spoon in the room. Parents are aware of lactation services and support group. Mom will bf on demand, 8+/24hr, post express, and spoon feed per volume guidelines.     Maternal Data    Feeding Feeding Type: Breast Fed Length of feed: 15 min  LATCH Score/Interventions                      Lactation Tools Discussed/Used     Consult Status Consult Status: Follow-up Date: 12/28/16 Follow-up type: In-patient    Denzil Hughes 12/27/2016, 4:52 PM

## 2016-12-27 NOTE — Plan of Care (Signed)
Problem: Pain Management: Goal: General experience of comfort will improve and pain level will decrease Outcome: Progressing Patient is having post operative incisional pain that is being managed with Motrin and Oxcodone. Will continue to assess patient for pain. Ambulation in hallways and rest periods encouraged.

## 2016-12-28 DIAGNOSIS — Z98891 History of uterine scar from previous surgery: Secondary | ICD-10-CM

## 2016-12-28 MED ORDER — IBUPROFEN 600 MG PO TABS: 600.0000 mg | ORAL_TABLET | Freq: Four times a day (QID) | ORAL | 0 refills | Status: DC

## 2016-12-28 MED ORDER — OXYCODONE HCL 5 MG PO TABS: 5.0000 mg | ORAL_TABLET | ORAL | 0 refills | Status: DC | PRN

## 2016-12-28 NOTE — Lactation Note (Signed)
This note was copied from a baby's chart. Lactation Consultation Note  Patient Name: Paula Houston [id number] Date: 12/28/2016 Reason for consult: Follow-up assessment Baby on breast when LC arrived. Baby demonstrating some good suckling bursts. Mom has had sore nipples but report this is improving, applying EBM/coconut oil, alternating with comfort gels. Mom feels baby is nursing well. Per chart review, baby has been to breast 10 times in past 24 hours for 10-15 minutes, 6 voids/3 stools. Weight loss now at 8.9% with 2.2% weight loss in past 26 hours. Advised Mom baby should be at breast 8-12 times in 24 hours and with feeding ques, encouraged to keep baby nursing for 15-20 minutes both breast some feedings. Mom has history of LMS with 1st baby, given Harmony Hand pump and encouraged post pumping during the day for 15 minutes (4 times/day) to maximize milk production. Also advised baby being at breast often will support good milk production.  Engorgement care reviewed if needed. OP f/u scheduled for Friday, 12/31/16 for feeding assessment - pre/post weight check. Encouraged to call for questions/concerns. Info given on Fenugreek supplement given to Mom for consideration.   Maternal Data    Feeding Feeding Type: Breast Fed Length of feed: 25 min  LATCH Score/Interventions Latch: Grasps breast easily, tongue down, lips flanged, rhythmical sucking. Intervention(s): Adjust position  Audible Swallowing: A few with stimulation  Type of Nipple: Everted at rest and after stimulation (short nipple shafts bilateral)  Comfort (Breast/Nipple): Filling, red/small blisters or bruises, mild/mod discomfort  Problem noted: Mild/Moderate discomfort Interventions (Mild/moderate discomfort): Comfort gels (EBM/coconut oil)  Hold (Positioning): No assistance needed to correctly position infant at breast. Intervention(s): Breastfeeding basics reviewed;Support Pillows;Position options;Skin to skin  LATCH  Score: 8  Lactation Tools Discussed/Used Tools: Pump;Comfort gels;Other (comment) (coconut oil) Breast pump type: Manual   Consult Status Consult Status: Complete Date: 12/28/16 Follow-up type: In-patient    [name] 12/28/2016, 9:08 AM

## 2016-12-28 NOTE — Discharge Summary (Signed)
OB Discharge Summary     Patient Name: Paula Houston DOB: 12/11/1995 MRN: [id number]  Date of admission: 12/24/2016 Delivering MD: Lavonia Drafts   Date of discharge: 12/28/2016  Admitting diagnosis: 39.6 weeks contractions 2-5 minutes apart Intrauterine pregnancy: [redacted]w[redacted]d     Secondary diagnosis:  Active Problems:   Cesarean delivery delivered  Additional problems: post operative fever     Discharge diagnosis: Term Pregnancy Delivered                                                                                                Post partum procedures:none  Augmentation: none  Complications: None  Hospital course:  Onset of Labor With Unplanned C/S  21 y.o. yo [id number] at [redacted]w[redacted]d was admitted in Latent Labor on 12/24/2016. Patient had a labor course significant for malpresentation (breech).  Unsuccessful version . Membrane Rupture Time/Date: 2:25 PM ,12/25/2016   The patient went for cesarean section due to Malpresentation, and delivered a Viable infant,12/25/2016  Details of operation can be found in separate operative note. Patient's  postpartum course was complicated by a fever on postpartum day 2 of 101.4 at 0651.  Patient was asymptomatic, no uterine tenderness.  She was not given antibiotics, but was observed for 24h without recurrence of fever.  She is ambulating,tolerating a regular diet, passing flatus, and urinating well.  Patient is discharged home in stable condition 12/28/16.  Physical exam  Vitals:   12/27/16 0952 12/27/16 1556 12/27/16 1841 12/28/16 0540  BP:   (!) 101/56 (!) 98/49  Pulse:   (!) 105 (!) 105  Resp:   18 18  Temp: 98.5 F (36.9 C) 99 F (37.2 C) 99.8 F (37.7 C) 99.2 F (37.3 C)  TempSrc: Oral  Oral Oral  SpO2:      Weight:      Height:       General: alert, cooperative and no distress Lochia: appropriate Uterine Fundus: firm Incision: Healing well with no significant drainage, No significant erythema, Dressing is clean, dry, and  intact DVT Evaluation: No evidence of DVT seen on physical exam. Labs: Lab Results  Component Value Date   WBC 17.9 (H) 12/27/2016   HGB 9.9 (L) 12/27/2016   HCT 29.4 (L) 12/27/2016   MCV 93.0 12/27/2016   PLT 277 12/27/2016   CMP Latest Ref Rng & Units 05/06/2014  Glucose 70 - 99 mg/dL 141(H)  BUN 6 - 23 mg/dL 8  Creatinine 0.50 - 1.10 mg/dL 0.64  Sodium 137 - 147 mEq/L 138  Potassium 3.7 - 5.3 mEq/L 3.3(L)  Chloride 96 - 112 mEq/L 100  CO2 19 - 32 mEq/L 26  Calcium 8.4 - 10.5 mg/dL 8.7  Total Protein 6.0 - 8.3 g/dL -  Total Bilirubin 0.3 - 1.2 mg/dL -  Alkaline Phos 47 - 119 U/L -  AST 0 - 37 U/L -  ALT 0 - 35 U/L -    Discharge instruction: per After Visit Summary and "Baby and Me Booklet".  After visit meds:  Allergies as of 12/28/2016      Reactions   Codeine Nausea And Vomiting  Medication List    TAKE these medications   acetaminophen 500 MG tablet Commonly known as:  TYLENOL Take 1,000 mg by mouth every 6 (six) hours as needed for mild pain.   flintstones complete 60 MG chewable tablet Chew 2 tablets by mouth every evening.   ibuprofen 600 MG tablet Commonly known as:  ADVIL,MOTRIN Take 1 tablet (600 mg total) by mouth every 6 (six) hours.   oxyCODONE 5 MG immediate release tablet Commonly known as:  Oxy IR/ROXICODONE Take 1-2 tablets (5-10 mg total) by mouth every 4 (four) hours as needed (pain scale 4-7 (1 tablet), pain scale 8-10 (2 tablets)).       Diet: routine diet  Activity: Advance as tolerated. Pelvic rest for 6 weeks.   Outpatient follow up:6 weeks Follow up Appt:Future Appointments Date Time Provider [place]  01/03/2017 2:45 PM Florian Buff, MD [facility]  02/07/2017 2:00 PM Roma Schanz, CNM [facility]   Follow up Visit:No Follow-up on file.  Postpartum contraception: Condoms  Newborn Data: Live born female  Birth Weight: 7 lb 8.8 oz (3425 g) APGAR: 8, 9  Baby Feeding:  Breast Disposition:home with mother   12/28/2016 Lulu Riding, MD

## 2016-12-31 ENCOUNTER — Telehealth: Payer: Self-pay | Admitting: Obstetrics & Gynecology

## 2016-12-31 ENCOUNTER — Ambulatory Visit: Payer: Self-pay

## 2016-12-31 NOTE — Telephone Encounter (Signed)
Pt called with question about removing the dressing covering her incision. I informed pt that it was ok to remove the dressing since her surgery was 6 days ago. I informed pt to keep incision dry and to call with any s/s of infection. Pt verbalized understanding.

## 2016-12-31 NOTE — Lactation Note (Signed)
This note was copied from a baby's chart. Lactation Consult  Mother's reason for visit:  Feeding assessment Visit Type:  Outpatient Appointment Notes:  Mom has history of LMS. Here for feeding assessment, pre/post weight check Consult:  Initial Lactation Consultant:  [name]  ________________________________________________________________________  Paula Houston Name:  Paula Houston Date of Birth:  12/25/2016 Pediatrician: [facility] - Dr. Wolfgang Phoenix Gender:  female Gestational Age: [redacted]w[redacted]d (At Birth) Birth Weight:  7 lb 8.8 oz (3425 g) Weight at Discharge:                          Date of Discharge:   There were no vitals filed for this visit. Last weight taken from location outside of [facility]:  12/29/16  7 lb. 0.0 oz    Location:Pediatrician's office Weight today:  7 lb. 0.5 oz/3190 with clean diaper, baby now 61 days old  ________________________________________________________________________  Mother's Name: Paula Houston Type of delivery:  C-Section, Low Transverse Breastfeeding Experience:  P2 - Mom had LMS with 1st baby, delayed lactogenisis II, Due to jaundice, Mom switched to formula Maternal Medical Conditions:  Polycystic ovarian syndrome Maternal Medications:  Oxycodone prn, Ibuprofen prn  ________________________________________________________________________  Breastfeeding History (Post Discharge)  Frequency of breastfeeding:  Every hour Duration of feeding:  15-20 minutes, usually 1 breast per feeding  Patient does not supplement or pump.  Infant Intake and Output Assessment  Voids:  8 in 24 hrs.  Color:  Clear yellow Stools:  4-5 in 24 hrs.  Color:  Brown  ________________________________________________________________________  Maternal Breast Assessment  Breast:  Soft Nipple:  Erect, bruising on left nipple, right nipple bifurcated. Pain level:  0, Mom reports intermittent discomfort with nursing.  Pain  interventions:  Comfort gels  _______________________________________________________________________ Feeding Assessment/Evaluation  Initial feeding assessment:  Infant's oral assessment:  Variance. Short labial frenulum  Positioning:  Cradle/cross cradle Left breast  LATCH documentation:  Latch:  2 = Grasps breast easily, tongue down, lips flanged, rhythmical sucking.  Audible swallowing:  1 = A few with stimulation  Type of nipple:  2 = Everted at rest and after stimulation  Comfort (Breast/Nipple):  2 = Soft / non-tender  Hold (Positioning):  1 = Assistance needed to correctly position infant at breast and maintain latch  LATCH score:  10 Mom latched baby independently in cradle hold to left breast. Baby demonstrated some good suckling bursts off/on. Baby tucking lips requiring adjustment, upper lip more than lower lip. Mom's nipple has compression line across top where bruise is located. Mom denied discomfort with baby nursing. Nipple lipstick shape when baby came off the breast. LC assisted Mom to reposition to cross cradle to see if baby could obtain more depth with latch and stay more engaged at breast. Baby becomes sleepy and demonstrates both nutritive suckling for about 10 minutes then more non-nutritive observed.  Pre-feed weight:  3190 g  (7 lb. 0.5 oz.) Post-feed weight:  3202 g (7 lb. 1.0 oz.) Amount transferred:  12 ml with nursing on left breast for 15 minutes.   Mom latched baby to right breast in cradle hold. Baby nursed off/on for 8 minutes, tucking upper lip intermittently. Changed to football hold, tried to get baby to use 20 nipple shield to see if milk transfer was improved but baby would not latch to nipple shield. Baby re-latched to bare nipple nursing off/on for total of 25 minutes. Pre-feed weight:  3202 g ( 7lb 1.0 oz)  Post feed weight:  3214 g ( 7 lb. 1.3 oz) Total transfer from right breast  = 12 ml  Total amount pumped post feed:  R 3 ml    L 5 ml  Total  amount transferred:  24 ml Total supplement given:  8 ml of EBM followed by 25 ml of Similac Advance Newborn 19 cal. = total supplement 33 ml  Mom is having evidence of LMS indicated by amount baby transferred at breast plus what she pumped. This is also evidenced by baby's feeding pattern of eating every hour at breast. Baby is noted to have short labial frenulum. Not sure if this has had effect on Mom's milk supply or not but baby was able to transfer the majority of milk available today. Mom does have intermittent pain with nursing and nipple compression which is most likely due to frenulum. All of which can affect milk supply. Mom has not been pumping or taking Fenugreek supplements as instructed before d/c in hospital. Mom does report she wants to keep working on BF and increasing milk supply.  Plan for home to keep working on BF and to increase milk volume: BF with feeding ques, at least 8-12 times in 24 hours Keep baby nursing for 15-20 minutes both breast when possible each feeding. Supplement every 3 hours with 30-45 ml of EBM/formula, more if needed to satisfy baby. Pump every 3 hours for 15-20 minutes to encourage milk production and to have EBM to supplement. Mom getting DEBP from friend.  Monitor voids/stools. Start Fenugreek supplements as instructed on hand out. OP f/u scheduled for Friday, 01/07/17 at 10:30 to see if making progress with plan.

## 2016-12-31 NOTE — Telephone Encounter (Signed)
Pt called stating that she had a c-section on 3/17 and is having her post op appointment coming up. Pt would like to know if she could remove her bandage. Please contact pt

## 2017-01-01 ENCOUNTER — Inpatient Hospital Stay (HOSPITAL_COMMUNITY)
Admission: AD | Admit: 2017-01-01 | Discharge: 2017-01-01 | Disposition: A | Discharge status: Home / Self Care | Payer: Medicaid Other | Source: Ambulatory Visit | Attending: Obstetrics & Gynecology | Admitting: Obstetrics & Gynecology

## 2017-01-01 DIAGNOSIS — Z836 Family history of other diseases of the respiratory system: Secondary | ICD-10-CM | POA: Insufficient documentation

## 2017-01-01 DIAGNOSIS — O860 Infection of obstetric surgical wound: Secondary | ICD-10-CM | POA: Diagnosis present

## 2017-01-01 DIAGNOSIS — L03311 Cellulitis of abdominal wall: Secondary | ICD-10-CM | POA: Diagnosis not present

## 2017-01-01 DIAGNOSIS — E282 Polycystic ovarian syndrome: Secondary | ICD-10-CM | POA: Insufficient documentation

## 2017-01-01 DIAGNOSIS — Z833 Family history of diabetes mellitus: Secondary | ICD-10-CM | POA: Diagnosis not present

## 2017-01-01 LAB — URINALYSIS, ROUTINE W REFLEX MICROSCOPIC
Bilirubin Urine: NEGATIVE
GLUCOSE, UA: NEGATIVE mg/dL
Ketones, ur: NEGATIVE mg/dL
NITRITE: NEGATIVE
Protein, ur: NEGATIVE mg/dL
SPECIFIC GRAVITY, URINE: 1.01 (ref 1.005–1.030)
pH: 8 (ref 5.0–8.0)

## 2017-01-01 MED ORDER — OXYCODONE-ACETAMINOPHEN 5-325 MG PO TABS: 1.0000 | ORAL_TABLET | ORAL | 0 refills | Status: DC | PRN

## 2017-01-01 MED ORDER — SULFAMETHOXAZOLE-TRIMETHOPRIM 800-160 MG PO TABS: 1.0000 | ORAL_TABLET | Freq: Two times a day (BID) | ORAL | 0 refills | Status: DC

## 2017-01-01 MED ORDER — OXYCODONE-ACETAMINOPHEN 5-325 MG PO TABS
2.0000 | ORAL_TABLET | Freq: Once | ORAL | Status: AC
  Administered 2017-01-01: 2 via ORAL
  Filled 2017-01-01: qty 2

## 2017-01-01 MED ORDER — CEPHALEXIN 500 MG PO CAPS
500.0000 mg | ORAL_CAPSULE | Freq: Once | ORAL | Status: AC
  Administered 2017-01-01: 500 mg via ORAL
  Filled 2017-01-01: qty 1

## 2017-01-01 MED ORDER — BUTALBITAL-APAP-CAFFEINE 50-325-40 MG PO TABS
2.0000 | ORAL_TABLET | Freq: Once | ORAL | Status: AC
  Administered 2017-01-01: 2 via ORAL
  Filled 2017-01-01: qty 2

## 2017-01-01 MED ORDER — OXYCODONE-ACETAMINOPHEN 5-325 MG PO TABS
1.0000 | ORAL_TABLET | ORAL | 0 refills | Status: DC | PRN
Start: 2017-01-01 — End: 2017-01-01

## 2017-01-01 NOTE — Discharge Instructions (Signed)
Cellulitis, Adult Cellulitis is a skin infection. The infected area is usually red and sore. This condition occurs most often in the arms and lower legs. It is very important to get treated for this condition. Follow these instructions at home:  Take over-the-counter and prescription medicines only as told by your doctor.  If you were prescribed an antibiotic medicine, take it as told by your doctor. Do not stop taking the antibiotic even if you start to feel better.  Drink enough fluid to keep your pee (urine) clear or pale yellow.  Do not touch or rub the infected area.  Raise (elevate) the infected area above the level of your heart while you are sitting or lying down.  Place warm or cold wet cloths (warm or cold compresses) on the infected area. Do this as told by your doctor.  Keep all follow-up visits as told by your doctor. This is important. These visits let your doctor make sure your infection is not getting worse. Contact a doctor if:  You have a fever.  Your symptoms do not get better after 1-2 days of treatment.  Your bone or joint under the infected area starts to hurt after the skin has healed.  Your infection comes back. This can happen in the same area or another area.  You have a swollen bump in the infected area.  You have new symptoms.  You feel ill and also have muscle aches and pains. Get help right away if:  Your symptoms get worse.  You feel very sleepy.  You throw up (vomit) or have watery poop (diarrhea) for a long time.  There are red streaks coming from the infected area.  Your red area gets larger.  Your red area turns darker. This information is not intended to replace advice given to you by your health care provider. Make sure you discuss any questions you have with your health care provider. Document Released: 03/15/2008 Document Revised: 03/04/2016 Document Reviewed: 08/06/2015 Elsevier Interactive Patient Education  2017 Elsevier  Inc.  

## 2017-01-01 NOTE — MAU Note (Signed)
Place near incision, pain that makes walking difficult.

## 2017-01-01 NOTE — MAU Provider Note (Signed)
History   S/P LTCS on 12/24/16 in with c/o increasing pain and tenderness on right side of incision. States started several days ago and has gotten progressively worse.  CSN: [id number]  Arrival date & time 01/01/17  1409   None     Chief Complaint  Patient presents with  . Postpartum Complications    HPI  Past Medical History:  Diagnosis Date  . Medical history non-contributory   . Painful menstrual periods 06/04/2014  . PCOS (polycystic ovarian syndrome)     Past Surgical History:  Procedure Laterality Date  . CESAREAN SECTION N/A 12/25/2016   Procedure: CESAREAN SECTION;  Surgeon: Lavonia Drafts, MD;  Location: [place];  Service: Obstetrics;  Laterality: N/A;  . WISDOM TOOTH EXTRACTION      Family History  Problem Relation Age of Onset  . COPD Paternal Grandmother   . Diabetes Mother   . Diabetes Brother     Social History  Substance Use Topics  . Smoking status: Never Smoker  . Smokeless tobacco: Never Used  . Alcohol use No    OB History    Gravida Para Term Preterm AB Living   2 2 2  0 0 2   SAB TAB Ectopic Multiple Live Births   0 0 0 0 2      Review of Systems  Constitutional: Negative.   HENT: Negative.   Eyes: Negative.   Respiratory: Negative.   Cardiovascular: Negative.   Gastrointestinal: Positive for abdominal pain.  Endocrine: Negative.   Genitourinary: Negative.   Musculoskeletal: Negative.   Skin: Negative.   Allergic/Immunologic: Negative.   Neurological: Negative.   Hematological: Negative.   Psychiatric/Behavioral: Negative.     Allergies  Codeine  Home Medications    BP 118/78 (BP Location: Left Arm)   Pulse (!) 116   Temp 98.9 F (37.2 C) (Oral)   Resp 19   Ht 5\' 6"  (1.676 m)   Wt 187 lb (84.8 kg)   SpO2 99%   BMI 30.18 kg/m   Physical Exam  Constitutional: She is oriented to person, place, and time. She appears well-developed and well-nourished.  HENT:  Head: Normocephalic.  Eyes: Pupils are  equal, round, and reactive to light.  Neck: Normal range of motion.  Cardiovascular: Normal rate, regular rhythm, normal heart sounds and intact distal pulses.   Pulmonary/Chest: Effort normal.  Abdominal: There is tenderness.  Right side of incision red, tender to touch. 2-3 cm firm area at top of right side of incision.  Musculoskeletal: Normal range of motion.  Neurological: She is alert and oriented to person, place, and time. She has normal reflexes.  Skin: Skin is warm and dry.  Psychiatric: She has a normal mood and affect. Her behavior is normal. Judgment and thought content normal.    MAU Course  Procedures (including critical care time)  Labs Reviewed  URINALYSIS, ROUTINE W REFLEX MICROSCOPIC   No results found.   No diagnosis found.    MDM  VSS, right side of incision red, tender to touch 2-3 cm area at top of incision firm to touch and very tender. Dr. leggett in to assess pt. Will d/c home or antibiotics and pain management to follow up with Dr. Glo Herring on monday

## 2017-01-03 ENCOUNTER — Encounter (HOSPITAL_COMMUNITY): Payer: Self-pay

## 2017-01-03 ENCOUNTER — Inpatient Hospital Stay (HOSPITAL_COMMUNITY)
Admission: AD | Admit: 2017-01-03 | Discharge: 2017-01-04 | Disposition: A | Discharge status: Home / Self Care | Payer: Medicaid Other | Source: Ambulatory Visit | Attending: Obstetrics and Gynecology | Admitting: Obstetrics and Gynecology

## 2017-01-03 ENCOUNTER — Encounter: Payer: Self-pay | Admitting: Obstetrics & Gynecology

## 2017-01-03 ENCOUNTER — Ambulatory Visit (INDEPENDENT_AMBULATORY_CARE_PROVIDER_SITE_OTHER): Payer: Medicaid Other | Admitting: Obstetrics & Gynecology

## 2017-01-03 VITALS — BP 100/60 | HR 101 | Wt 181.0 lb

## 2017-01-03 DIAGNOSIS — IMO0001 Reserved for inherently not codable concepts without codable children: Secondary | ICD-10-CM

## 2017-01-03 DIAGNOSIS — T814XXS Infection following a procedure, sequela: Secondary | ICD-10-CM

## 2017-01-03 DIAGNOSIS — Z833 Family history of diabetes mellitus: Secondary | ICD-10-CM | POA: Insufficient documentation

## 2017-01-03 DIAGNOSIS — Z836 Family history of other diseases of the respiratory system: Secondary | ICD-10-CM | POA: Insufficient documentation

## 2017-01-03 DIAGNOSIS — E282 Polycystic ovarian syndrome: Secondary | ICD-10-CM | POA: Insufficient documentation

## 2017-01-03 DIAGNOSIS — O860 Infection of obstetric surgical wound: Secondary | ICD-10-CM | POA: Insufficient documentation

## 2017-01-03 DIAGNOSIS — Z885 Allergy status to narcotic agent status: Secondary | ICD-10-CM | POA: Insufficient documentation

## 2017-01-03 DIAGNOSIS — T814XXD Infection following a procedure, subsequent encounter: Secondary | ICD-10-CM

## 2017-01-03 MED ORDER — CEPHALEXIN 500 MG PO CAPS: 500.0000 mg | ORAL_CAPSULE | Freq: Three times a day (TID) | ORAL | 0 refills | Status: DC

## 2017-01-03 NOTE — MAU Note (Signed)
Pt presents stating that one side of her incision opened and is draining yellow fluid. Site is sore but not in pain.

## 2017-01-03 NOTE — Progress Notes (Signed)
Chief Complaint  Patient presents with  . Wound Check    Blood pressure 100/60, pulse (!) 101, weight 181 lb (82.1 kg), unknown if currently breastfeeding.  21 y.o. [id number] No LMP recorded. The current method of family planning is none.  Outpatient Encounter Prescriptions as of 01/03/2017  Medication Sig  . flintstones complete (FLINTSTONES) 60 MG chewable tablet Chew 2 tablets by mouth at bedtime.   Marland Kitchen ibuprofen (ADVIL,MOTRIN) 600 MG tablet Take 1 tablet (600 mg total) by mouth every 6 (six) hours. (Patient taking differently: Take 600 mg by mouth every 6 (six) hours as needed for mild pain or moderate pain. )  . oxyCODONE-acetaminophen (ROXICET) 5-325 MG tablet Take 1 tablet by mouth every 4 (four) hours as needed for severe pain.  Marland Kitchen sulfamethoxazole-trimethoprim (BACTRIM DS) 800-160 MG tablet Take 1 tablet by mouth 2 (two) times daily.  . cephALEXin (KEFLEX) 500 MG capsule Take 1 capsule (500 mg total) by mouth 3 (three) times daily.  Marland Kitchen docusate sodium (COLACE) 100 MG capsule Take 100 mg by mouth daily as needed for mild constipation.   No facility-administered encounter medications on file as of 01/03/2017.     Subjective Pt seen in MAU 2 days ago for erythema warmth and tenderness of right portion of the incision Diagnosed with cellulitis, instructed to come back in today for follow up  Still tender and red  Objective Incision still with signifcant erythema right side of inciion No drainage  Pertinent ROS No burning with urination, frequency or urgency No nausea, vomiting or diarrhea Nor fever chills or other constitutional symptoms   Labs or studies     Impression Diagnoses this Encounter::   ICD-9-CM ICD-10-CM   1. Infected incision, sequela 909.3 T81.4XXS     Established relevant diagnosis(es):   Plan/Recommendations: Meds ordered this encounter  Medications  . cephALEXin (KEFLEX) 500 MG capsule    Sig: Take 1 capsule (500 mg total) by mouth 3  (three) times daily.    Dispense:  21 capsule    Refill:  0    Labs or Scans Ordered: No orders of the defined types were placed in this encounter.   Management:: Stop bactrim ds since has progressed Will change to keflex 500 TID Treated with gentian violet  Follow up Return in about 1 week (around 01/10/2017) for Follow up, with Dr Elonda Husky.         All questions were answered.  Past Medical History:  Diagnosis Date  . Medical history non-contributory   . Painful menstrual periods 06/04/2014  . PCOS (polycystic ovarian syndrome)     Past Surgical History:  Procedure Laterality Date  . CESAREAN SECTION N/A 12/25/2016   Procedure: CESAREAN SECTION;  Surgeon: Lavonia Drafts, MD;  Location: [facility];  Service: Obstetrics;  Laterality: N/A;  . WISDOM TOOTH EXTRACTION      OB History    Gravida Para Term Preterm AB Living   2 2 2  0 0 2   SAB TAB Ectopic Multiple Live Births   0 0 0 0 2      Allergies  Allergen Reactions  . Codeine Nausea And Vomiting    Social History   Social History  . Marital status: Married    Spouse name: N/A  . Number of children: N/A  . Years of education: N/A   Social History Main Topics  . Smoking status: Never Smoker  . Smokeless tobacco: Never Used  . Alcohol use No  . Drug use:  No  . Sexual activity: Not Currently    Birth control/ protection: None   Other Topics Concern  . None   Social History Narrative  . None    Family History  Problem Relation Age of Onset  . COPD Paternal Grandmother   . Diabetes Mother   . Diabetes Brother

## 2017-01-04 DIAGNOSIS — O860 Infection of obstetric surgical wound: Secondary | ICD-10-CM | POA: Diagnosis not present

## 2017-01-04 DIAGNOSIS — Z836 Family history of other diseases of the respiratory system: Secondary | ICD-10-CM | POA: Diagnosis not present

## 2017-01-04 DIAGNOSIS — T814XXD Infection following a procedure, subsequent encounter: Secondary | ICD-10-CM | POA: Diagnosis present

## 2017-01-04 DIAGNOSIS — E282 Polycystic ovarian syndrome: Secondary | ICD-10-CM | POA: Diagnosis not present

## 2017-01-04 DIAGNOSIS — Z885 Allergy status to narcotic agent status: Secondary | ICD-10-CM | POA: Diagnosis not present

## 2017-01-04 DIAGNOSIS — Z833 Family history of diabetes mellitus: Secondary | ICD-10-CM | POA: Diagnosis not present

## 2017-01-04 MED ORDER — OXYCODONE-ACETAMINOPHEN 5-325 MG PO TABS: 1.0000 | ORAL_TABLET | Freq: Four times a day (QID) | ORAL | 0 refills | Status: DC | PRN

## 2017-01-04 MED ORDER — OXYCODONE-ACETAMINOPHEN 5-325 MG PO TABS
2.0000 | ORAL_TABLET | Freq: Once | ORAL | Status: AC
  Administered 2017-01-04: 2 via ORAL
  Filled 2017-01-04: qty 2

## 2017-01-04 NOTE — MAU Provider Note (Signed)
History     CSN: [id number]  Arrival date and time: 01/03/17 2341   First Provider Initiated Contact with Patient 01/04/17 0003      Chief Complaint  Patient presents with  . Drainage from Incision   Paula Houston is a 21 y.o. [id number] who is S/P c-section on 12/25/16. She was seen here on 01/02/16 with a mild cellulitis. She was sent home on bactrim. Seen in the office on 01/04/16, and antibiotic changed to keflex. She has only had one dose of keflex at this time. She states that today her pain increased. Then the incision opened up and was draining yellow pus. She reports a temp of 100.0 at home, but does not have a temp here.      Past Medical History:  Diagnosis Date  . Medical history non-contributory   . Painful menstrual periods 06/04/2014  . PCOS (polycystic ovarian syndrome)     Past Surgical History:  Procedure Laterality Date  . CESAREAN SECTION N/A 12/25/2016   Procedure: CESAREAN SECTION;  Surgeon: Lavonia Drafts, MD;  Location: [facility];  Service: Obstetrics;  Laterality: N/A;  . WISDOM TOOTH EXTRACTION      Family History  Problem Relation Age of Onset  . COPD Paternal Grandmother   . Diabetes Mother   . Diabetes Brother     Social History  Substance Use Topics  . Smoking status: Never Smoker  . Smokeless tobacco: Never Used  . Alcohol use No    Allergies:  Allergies  Allergen Reactions  . Codeine Nausea And Vomiting    Prescriptions Prior to Admission  Medication Sig Dispense Refill Last Dose  . cephALEXin (KEFLEX) 500 MG capsule Take 1 capsule (500 mg total) by mouth 3 (three) times daily. 21 capsule 0   . docusate sodium (COLACE) 100 MG capsule Take 100 mg by mouth daily as needed for mild constipation.   Not Taking  . flintstones complete (FLINTSTONES) 60 MG chewable tablet Chew 2 tablets by mouth at bedtime.    Taking  . ibuprofen (ADVIL,MOTRIN) 600 MG tablet Take 1 tablet (600 mg total) by mouth every 6 (six) hours.  (Patient taking differently: Take 600 mg by mouth every 6 (six) hours as needed for mild pain or moderate pain. ) 30 tablet 0 Taking  . oxyCODONE-acetaminophen (ROXICET) 5-325 MG tablet Take 1 tablet by mouth every 4 (four) hours as needed for severe pain. 30 tablet 0 Taking  . sulfamethoxazole-trimethoprim (BACTRIM DS) 800-160 MG tablet Take 1 tablet by mouth 2 (two) times daily. 20 tablet 0 Taking    Review of Systems Physical Exam   Blood pressure 112/69, pulse 100, temperature 98.5 F (36.9 C), temperature source Oral, resp. rate 18, unknown if currently breastfeeding.  Physical Exam  Nursing note and vitals reviewed. Constitutional: She appears well-developed and well-nourished. No distress.  HENT:  Head: Normocephalic.  Cardiovascular: Normal rate.   Respiratory: Effort normal.  GI: Soft. There is no tenderness. There is no rebound.  All around the incision is purple, painted with gentian violet. One the right side of the incision there is a 1cm opening that is draining yellow drainage.     MAU Course  Procedures  MDM 0015: Dr. Ilda Basset notified. He will come assess the wound.  3500: Dr. Ilda Basset on the unit to evaluate the patient.  Dr. Ilda Basset irrigated the wound. Instructions given on wet to dry dressings, continue antibiotics and FU with the office as planned.   Assessment and Plan   1.  Postoperative wound infection, subsequent encounter    DC home Comfort measures reviewed  Wet to dry dressing changes  RX: no new, continue keflex as prescribed. Wound culture pending  Return to MAU as needed FU with OB as planned  Follow-up Information    Florian Buff, MD Follow up.   Specialties:  Obstetrics and Gynecology, Radiology Why:  as scheduled  Contact information: [place] [address] [phone]            Mathis Bud 01/04/2017, 12:07 AM

## 2017-01-04 NOTE — Discharge Instructions (Signed)
Wound Infection A wound infection happens when germs start to grow in the wound. Germs that cause wound infections are most often bacteria. Other types of infections can occur as well. In some cases, infection can cause the wound to break open. Wound infections need treatment. If a wound infection is not treated, complications can happen. Follow these instructions at home: Medicines   Take or apply over-the-counter and prescription medicines only as told by your doctor.  If you were prescribed antibiotic medicine, take or apply it as told by your doctor. Do not stop using the antibiotic even if your condition improves. Wound care   Clean the wound each day or as told by your doctor.  Wash the wound with mild soap and water.  Rinse the wound with water to remove all soap.  Pat the wound dry with a clean towel. Do not rub it.  Follow instructions from your doctor about how to take care of your wound. Make sure you:  Wash your hands with soap and water before you change your bandage (dressing). If you cannot use soap and water, use hand sanitizer.  Change your bandage as told by your doctor.  Leave stitches (sutures), skin glue, or skin tape (adhesive) strips in place if your wound has been closed. They may need to stay in place for 2 weeks or longer. If tape strips get loose and curl up, you may trim the loose edges. Do not remove tape strips completely unless your doctor says it is okay. Some wounds are left open to heal on their own.  Check your wound every day for signs of infection. Watch for:  More redness, swelling, or pain.  More fluid or blood.  Warmth.  Pus or a bad smell. General instructions   Keep the bandage dry until your doctor says it can be removed.  Do not take baths, swim, use a hot tub, or do anything that would put your wound underwater until your doctor says it is okay.  Raise (elevate) the injured area above the level of your heart while you are sitting  or lying down.  Do not scratch or pick at the wound.  Keep all follow-up visits as told by your doctor. This is important. Contact a doctor if:  Medicine does not help your pain.  You have more redness, swelling, or pain in the area of your wound.  You have more fluid or blood coming from your wound.  Your wound feels warm to the touch.  You have pus coming from your wound.  You continue to notice a bad smell coming from your wound or your bandage.  Your wound that was closed breaks open. Get help right away if:  You have a red streak going away from your wound.  You have a fever. This information is not intended to replace advice given to you by your health care provider. Make sure you discuss any questions you have with your health care provider. Document Released: 07/06/2008 Document Revised: 03/04/2016 Document Reviewed: 03/17/2015 Elsevier Interactive Patient Education  2017 [employer].

## 2017-01-06 LAB — AEROBIC CULTURE W GRAM STAIN (SUPERFICIAL SPECIMEN): Special Requests: NORMAL

## 2017-01-06 LAB — AEROBIC CULTURE  (SUPERFICIAL SPECIMEN)

## 2017-01-11 ENCOUNTER — Encounter: Payer: Self-pay | Admitting: Obstetrics & Gynecology

## 2017-01-11 ENCOUNTER — Ambulatory Visit (INDEPENDENT_AMBULATORY_CARE_PROVIDER_SITE_OTHER): Payer: Medicaid Other | Admitting: Obstetrics & Gynecology

## 2017-01-11 VITALS — BP 100/70 | HR 70 | Wt 179.0 lb

## 2017-01-11 DIAGNOSIS — O9089 Other complications of the puerperium, not elsewhere classified: Secondary | ICD-10-CM | POA: Diagnosis not present

## 2017-01-11 NOTE — Progress Notes (Signed)
  HPI: Patient returns for routine postoperative follow-up having undergone Caesarean section  on 3/17.  The patient's immediate postoperative recovery has been unremarkable. Since hospital discharge the patient reports improving cellulitis.   Current Outpatient Prescriptions: ibuprofen (ADVIL,MOTRIN) 600 MG tablet, Take 1 tablet (600 mg total) by mouth every 6 (six) hours., Disp: 30 tablet, Rfl: 0 oxyCODONE-acetaminophen (PERCOCET/ROXICET) 5-325 MG tablet, Take 1-2 tablets by mouth every 6 (six) hours as needed for severe pain., Disp: 15 tablet, Rfl: 0 docusate sodium (COLACE) 100 MG capsule, Take 100 mg by mouth daily as needed for mild constipation., Disp: , Rfl:  flintstones complete (FLINTSTONES) 60 MG chewable tablet, Chew 2 tablets by mouth at bedtime. , Disp: , Rfl:   No current facility-administered medications for this visit.     Blood pressure 100/70, pulse 70, weight 179 lb (81.2 kg), unknown if currently breastfeeding.  Physical Exam: Improved cellulitis on keflex Has drained a bit consistent with a seroma  Diagnostic Tests:   Pathology:   Impression: s/p c section with post op cellulitis and seroma  Plan:   Follow up: 2  weeks  Florian Buff, MD

## 2017-01-25 ENCOUNTER — Ambulatory Visit (INDEPENDENT_AMBULATORY_CARE_PROVIDER_SITE_OTHER): Payer: Medicaid Other | Admitting: Obstetrics & Gynecology

## 2017-01-25 ENCOUNTER — Encounter: Payer: Self-pay | Admitting: Obstetrics & Gynecology

## 2017-01-25 VITALS — BP 120/70 | HR 88 | Ht 66.0 in | Wt 180.0 lb

## 2017-01-25 DIAGNOSIS — B372 Candidiasis of skin and nail: Secondary | ICD-10-CM | POA: Diagnosis not present

## 2017-01-25 NOTE — Progress Notes (Signed)
  HPI: Patient returns for routine postoperative follow-up having undergone Caesarean section  on 3/17.  The patient's immediate postoperative recovery has been unremarkable. Since hospital discharge the patient reports improving cellulitis.   Current Outpatient Prescriptions: ibuprofen (ADVIL,MOTRIN) 600 MG tablet, Take 1 tablet (600 mg total) by mouth every 6 (six) hours., Disp: 30 tablet, Rfl: 0 oxyCODONE-acetaminophen (PERCOCET/ROXICET) 5-325 MG tablet, Take 1-2 tablets by mouth every 6 (six) hours as needed for severe pain., Disp: 15 tablet, Rfl: 0 docusate sodium (COLACE) 100 MG capsule, Take 100 mg by mouth daily as needed for mild constipation., Disp: , Rfl:  flintstones complete (FLINTSTONES) 60 MG chewable tablet, Chew 2 tablets by mouth at bedtime. , Disp: , Rfl:   No current facility-administered medications for this visit.     Blood pressure 100/70, pulse 70, weight 179 lb (81.2 kg), unknown if currently breastfeeding.  Physical Exam: Improved cellulitis on keflex Has drained a bit consistent with a seroma  Diagnostic Tests:   Pathology:   Impression: s/p c section with post op cellulitis and seroma  Plan:   Follow up: Keep pp appt  Florian Buff, MD

## 2017-02-07 ENCOUNTER — Encounter: Payer: Self-pay | Admitting: Women's Health

## 2017-02-07 ENCOUNTER — Ambulatory Visit (INDEPENDENT_AMBULATORY_CARE_PROVIDER_SITE_OTHER): Payer: Medicaid Other | Admitting: Women's Health

## 2017-02-07 DIAGNOSIS — Z98891 History of uterine scar from previous surgery: Secondary | ICD-10-CM

## 2017-02-07 NOTE — Patient Instructions (Signed)
Take prenatal vitamin if not using condoms

## 2017-02-07 NOTE — Progress Notes (Signed)
Subjective:    Paula Houston is a 21 y.o. G91P2002 Caucasian female who presents for a postpartum visit. She is 6 weeks postpartum following a primary cesarean section, low transverse incision at 39.6 gestational weeks d/t breech presentation w/ labor and failed ECV. Anesthesia: spinal. I have fully reviewed the prenatal and intrapartum course. Postpartum course has been complicated by cellulitis/seroma of incision, pt states much better- still has small hole on Rt side of incision, but getting better. Baby's course has been uncomplicated. Baby is feeding by bottle. Bleeding no bleeding. Bowel function is normal. Bladder function is normal. Patient is sexually active.  Contraception method is none and plans condoms, husband wants another baby. Postpartum depression screening: negative. Score 0.  Last pap never, just turned 21yo.  The following portions of the patient's history were reviewed and updated as appropriate: allergies, current medications, past medical history, past surgical history and problem list.  Review of Systems Pertinent items are noted in HPI.   Vitals:   02/07/17 1412  BP: 102/60  Pulse: 96  Weight: 182 lb (82.6 kg)  Height: 5\' 6"  (1.676 m)   Patient's last menstrual period was 12/01/2015.  Objective:   General:  alert, cooperative and no distress   Breasts:  deferred, no complaints  Lungs: clear to auscultation bilaterally  Heart:  regular rate and rhythm  Abdomen: soft, nontender, c/s incision healing well, very small open area Rt end of incision, no s/s infection, entire incision still painted w/ gentian violet   Vulva: normal  Vagina: normal vagina  Cervix:  closed  Corpus: Well-involuted  Adnexa:  Non-palpable  Rectal Exam: No hemorrhoids        Assessment:   Postpartum exam 6 wks s/p PLTCS d/t breech Bottlefeeding Depression screening Contraception counseling   Plan:  Contraception: condoms  Begin pnv if not using condoms Let us know if c/s  incision not continuing to heal Follow up in: 1 month for pap & physical, or earlier if needed  Tawnya Crook CNM, [facility] 02/07/2017 2:28 PM

## 2017-02-10 ENCOUNTER — Telehealth: Payer: Self-pay | Admitting: *Deleted

## 2017-02-10 NOTE — Telephone Encounter (Signed)
Pt was advised to call the lactation consultant at [facility] to try and re-introduce breastfeeding. Pt voiced understanding. [place]

## 2017-03-09 ENCOUNTER — Other Ambulatory Visit: Payer: Medicaid Other | Admitting: Women's Health

## 2017-03-21 ENCOUNTER — Other Ambulatory Visit: Payer: Medicaid Other | Admitting: Women's Health

## 2017-04-01 ENCOUNTER — Ambulatory Visit (INDEPENDENT_AMBULATORY_CARE_PROVIDER_SITE_OTHER): Payer: Medicaid Other | Admitting: Family Medicine

## 2017-04-01 ENCOUNTER — Encounter: Payer: Self-pay | Admitting: Family Medicine

## 2017-04-01 VITALS — BP 128/70 | Temp 97.7°F | Wt 182.4 lb

## 2017-04-01 DIAGNOSIS — F4320 Adjustment disorder, unspecified: Secondary | ICD-10-CM

## 2017-04-01 DIAGNOSIS — R309 Painful micturition, unspecified: Secondary | ICD-10-CM

## 2017-04-01 DIAGNOSIS — S161XXA Strain of muscle, fascia and tendon at neck level, initial encounter: Secondary | ICD-10-CM

## 2017-04-01 DIAGNOSIS — F4321 Adjustment disorder with depressed mood: Secondary | ICD-10-CM

## 2017-04-01 LAB — POCT URINALYSIS DIPSTICK
Spec Grav, UA: <=1.005 — AB (ref 1.010–1.025)
pH, UA: 8 (ref 5.0–8.0)

## 2017-04-01 MED ORDER — NAPROXEN 500 MG PO TABS: 500.0000 mg | ORAL_TABLET | Freq: Two times a day (BID) | ORAL | 0 refills | Status: DC

## 2017-04-01 MED ORDER — CIPROFLOXACIN HCL 250 MG PO TABS: 250.0000 mg | ORAL_TABLET | Freq: Two times a day (BID) | ORAL | 0 refills | Status: DC

## 2017-04-01 MED ORDER — CHLORZOXAZONE 500 MG PO TABS: 500.0000 mg | ORAL_TABLET | Freq: Three times a day (TID) | ORAL | 0 refills | Status: DC | PRN
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
## 2017-07-25 ENCOUNTER — Other Ambulatory Visit: Payer: Self-pay | Admitting: Women's Health

## 2017-08-04 ENCOUNTER — Other Ambulatory Visit: Payer: Self-pay | Admitting: Women's Health

## 2017-09-05 ENCOUNTER — Ambulatory Visit: Payer: Medicaid Other | Admitting: Women's Health

## 2017-09-05 ENCOUNTER — Other Ambulatory Visit (HOSPITAL_COMMUNITY)
Admission: RE | Admit: 2017-09-05 | Discharge: 2017-09-05 | Disposition: A | Discharge status: Home / Self Care | Payer: Medicaid Other | Source: Ambulatory Visit | Attending: Obstetrics & Gynecology | Admitting: Obstetrics & Gynecology

## 2017-09-05 ENCOUNTER — Encounter: Payer: Self-pay | Admitting: Women's Health

## 2017-09-05 VITALS — BP 124/70 | HR 81 | Ht 66.0 in | Wt 181.0 lb

## 2017-09-05 DIAGNOSIS — N926 Irregular menstruation, unspecified: Secondary | ICD-10-CM

## 2017-09-05 DIAGNOSIS — Z01419 Encounter for gynecological examination (general) (routine) without abnormal findings: Secondary | ICD-10-CM | POA: Insufficient documentation

## 2017-09-05 DIAGNOSIS — Z3200 Encounter for pregnancy test, result unknown: Secondary | ICD-10-CM

## 2017-09-05 DIAGNOSIS — Z01411 Encounter for gynecological examination (general) (routine) with abnormal findings: Secondary | ICD-10-CM

## 2017-09-05 DIAGNOSIS — Z0001 Encounter for general adult medical examination with abnormal findings: Secondary | ICD-10-CM | POA: Diagnosis not present

## 2017-09-05 DIAGNOSIS — E282 Polycystic ovarian syndrome: Secondary | ICD-10-CM

## 2017-09-05 DIAGNOSIS — Z3202 Encounter for pregnancy test, result negative: Secondary | ICD-10-CM | POA: Diagnosis not present

## 2017-09-05 LAB — POCT URINE PREGNANCY: Preg Test, Ur: NEGATIVE

## 2017-09-05 MED ORDER — DESOGESTREL-ETHINYL ESTRADIOL 0.15-30 MG-MCG PO TABS: 1.0000 | ORAL_TABLET | Freq: Every day | ORAL | 3 refills | Status: DC

## 2017-09-05 NOTE — Progress Notes (Signed)
WELL-WOMAN EXAMINATION Patient name: Paula Houston MRN [id number]  Date of birth: 22-Jul-1996 Chief Complaint:   Gynecologic Exam  History of Present Illness:   Paula Houston is a 21 y.o. G38P2002 Caucasian female being seen today for a routine well-woman exam.  Current complaints: no period in 4 months, wants something to help regulate. H/O PCOS, had to take femara/metformin to conceive last pregnancy. Not using birth control, hasn't taken UPT in app 3-18mths, although doesn't feel pregnant. Does not desire pregnancy right now, maybe when 58mth old daughter is 1.5-2yo. Does not want depo- makes her gain weight. Dr. Elonda Husky put her on pills in 2016 that did help regulate periods. Does not smoke, no h/o HTN, DVT/PE, CVA, MI, or migraines w/ aura. Bottlefeeding.  Also reports after having sex multiple days in a row, has pressure/discomfort like getting UTI. States vaginal area is very sensitive and uses baby wipes and soap which helps. Husband is uncircumcised. No current sx.   PCP: Wolfgang Phoenix      does not desire labs No LMP recorded. The current method of family planning is none Last pap never. Results were: n/a Last mammogram: never. Results were: n/a Last colonoscopy: never. Results were: n/a  Review of Systems:   Pertinent items are noted in HPI Denies any headaches, blurred vision, fatigue, shortness of breath, chest pain, abdominal pain, abnormal vaginal discharge/itching/odor/irritation, problems with periods, bowel movements, urination, or intercourse unless otherwise stated above. Pertinent History Reviewed:  Reviewed past medical,surgical, social and family history.  Reviewed problem list, medications and allergies. Physical Assessment:   Vitals:   09/05/17 1020  BP: 124/70  Pulse: 81  Weight: 181 lb (82.1 kg)  Height: 5\' 6"  (1.676 m)  Body mass index is 29.21 kg/m.        Physical Examination:   General appearance - well appearing, and in no distress  Mental status -  alert, oriented to person, place, and time  Psych:  She has a normal mood and affect  Skin - warm and dry, normal color, no suspicious lesions noted  Chest - effort normal, all lung fields clear to auscultation bilaterally  Heart - normal rate and regular rhythm  Neck:  midline trachea, no thyromegaly or nodules  Breasts - breasts appear normal, no suspicious masses, no skin or nipple changes or  axillary nodes  Abdomen - soft, nontender, nondistended, no masses or organomegaly  Pelvic - VULVA: normal appearing vulva with no masses, tenderness or lesions  VAGINA: normal appearing vagina with normal color and discharge, no lesions  CERVIX: normal appearing cervix without discharge or lesions, no CMT  Thin prep pap is done w/ reflex HR HPV cotesting  UTERUS: uterus is felt to be normal size, shape, consistency and nontender   ADNEXA: No adnexal masses or tenderness noted.  Extremities:  No swelling or varicosities noted  Results for orders placed or performed in visit on 09/05/17 (from the past 24 hour(s))  POCT urine pregnancy   Collection Time: 09/05/17 10:47 AM  Result Value Ref Range   Preg Test, Ur Negative Negative    Assessment & Plan:  1) Well-Woman Exam  2) PCOS w/ irregular periods> rx desogestrel EE 85mcg COC 3pk w/ 3RF  Labs/procedures today: pap  Mammogram @21yo  or sooner if problems Colonoscopy @21yo  or sooner if problems  Orders Placed This Encounter  Procedures  . POCT urine pregnancy    Follow-up: Return in about 3 months (around 12/06/2017) for F/U.  Ridwan Bondy, Hale Drone CNM, WHNP-BC  09/05/2017 11:00 AM

## 2017-09-05 NOTE — Patient Instructions (Signed)
Oral Contraception Use Oral contraceptive pills (OCPs) are medicines taken to prevent pregnancy. OCPs work by preventing the ovaries from releasing eggs. The hormones in OCPs also cause the cervical mucus to thicken, preventing the sperm from entering the uterus. The hormones also cause the uterine lining to become thin, not allowing a fertilized egg to attach to the inside of the uterus. OCPs are highly effective when taken exactly as prescribed. However, OCPs do not prevent sexually transmitted diseases (STDs). Safe sex practices, such as using condoms along with an OCP, can help prevent STDs. Before taking OCPs, you may have a physical exam and Pap test. Your health care provider may also order blood tests if necessary. Your health care provider will make sure you are a good candidate for oral contraception. Discuss with your health care provider the possible side effects of the OCP you may be prescribed. When starting an OCP, it can take 2 to 3 months for the body to adjust to the changes in hormone levels in your body. How to take oral contraceptive pills Your health care provider may advise you on how to start taking the first cycle of OCPs. Otherwise, you can:  Start on day 1 of your menstrual period. You will not need any backup contraceptive protection with this start time.  Start on the first Sunday after your menstrual period or the day you get your prescription. In these cases, you will need to use backup contraceptive protection for the first week.  Start the pill at any time of your cycle. If you take the pill within 5 days of the start of your period, you are protected against pregnancy right away. In this case, you will not need a backup form of birth control. If you start at any other time of your menstrual cycle, you will need to use another form of birth control for 7 days. If your OCP is the type called a minipill, it will protect you from pregnancy after taking it for 2 days (48  hours).  After you have started taking OCPs:  If you forget to take 1 pill, take it as soon as you remember. Take the next pill at the regular time.  If you miss 2 or more pills, call your health care provider because different pills have different instructions for missed doses. Use backup birth control until your next menstrual period starts.  If you use a 28-day pack that contains inactive pills and you miss 1 of the last 7 pills (pills with no hormones), it will not matter. Throw away the rest of the non-hormone pills and start a new pill pack.  No matter which day you start the OCP, you will always start a new pack on that same day of the week. Have an extra pack of OCPs and a backup contraceptive method available in case you miss some pills or lose your OCP pack. Follow these instructions at home:  Do not smoke.  Always use a condom to protect against STDs. OCPs do not protect against STDs.  Use a calendar to mark your menstrual period days.  Read the information and directions that came with your OCP. Talk to your health care provider if you have questions. Contact a health care provider if:  You develop nausea and vomiting.  You have abnormal vaginal discharge or bleeding.  You develop a rash.  You miss your menstrual period.  You are losing your hair.  You need treatment for mood swings or depression.  You   get dizzy when taking the OCP.  You develop acne from taking the OCP.  You become pregnant. Get help right away if:  You develop chest pain.  You develop shortness of breath.  You have an uncontrolled or severe headache.  You develop numbness or slurred speech.  You develop visual problems.  You develop pain, redness, and swelling in the legs. This information is not intended to replace advice given to you by your health care provider. Make sure you discuss any questions you have with your health care provider. Document Released: 09/16/2011 Document  Revised: 03/04/2016 Document Reviewed: 03/18/2013 Elsevier Interactive Patient Education  2017 Elsevier Inc.   Polycystic Ovarian Syndrome Polycystic ovarian syndrome (PCOS) is a common hormonal disorder among women of reproductive age. In most women with PCOS, many small fluid-filled sacs (cysts) grow on the ovaries, and the cysts are not part of a normal menstrual cycle. PCOS can cause problems with your menstrual periods and make it difficult to get pregnant. It can also cause an increased risk of miscarriage with pregnancy. If it is not treated, PCOS can lead to serious health problems, such as diabetes and heart disease. What are the causes? The cause of PCOS is not known, but it may be the result of a combination of certain factors, such as:  Irregular menstrual cycle.  High levels of certain hormones (androgens).  Problems with the hormone that helps to control blood sugar (insulin resistance).  Certain genes.  What increases the risk? This condition is more likely to develop in women who have a family history of PCOS. What are the signs or symptoms? Symptoms of PCOS may include:  Multiple ovarian cysts.  Infrequent periods or no periods.  Periods that are too frequent or too heavy.  Unpredictable periods.  Inability to get pregnant (infertility) because of not ovulating.  Increased growth of hair on the face, chest, stomach, back, thumbs, thighs, or toes.  Acne or oily skin. Acne may develop during adulthood, and it may not respond to treatment.  Pelvic pain.  Weight gain or obesity.  Patches of thickened and dark brown or black skin on the neck, arms, breasts, or thighs (acanthosis nigricans).  Excess hair growth on the face, chest, abdomen, or upper thighs (hirsutism).  How is this diagnosed? This condition is diagnosed based on:  Your medical history.  A physical exam, including a pelvic exam. Your health care provider may look for areas of increased hair  growth on your skin.  Tests, such as: ? Ultrasound. This may be used to examine the ovaries and the lining of the uterus (endometrium) for cysts. ? Blood tests. These may be used to check levels of sugar (glucose), female hormone (testosterone), and female hormones (estrogen and progesterone) in your blood.  How is this treated? There is no cure for PCOS, but treatment can help to manage symptoms and prevent more health problems from developing. Treatment varies depending on:  Your symptoms.  Whether you want to have a baby or whether you need birth control (contraception).  Treatment may include nutrition and lifestyle changes along with:  Progesterone hormone to start a menstrual period.  Birth control pills to help you have regular menstrual periods.  Medicines to make you ovulate, if you want to get pregnant.  Medicine to reduce excessive hair growth.  Surgery, in severe cases. This may involve making small holes in one or both of your ovaries. This decreases the amount of testosterone that your body produces.  Follow these  instructions at home:  Take over-the-counter and prescription medicines only as told by your health care provider.  Follow a healthy meal plan. This can help you reduce the effects of PCOS. ? Eat a healthy diet that includes lean proteins, complex carbohydrates, fresh fruits and vegetables, low-fat dairy products, and healthy fats. Make sure to eat enough fiber.  If you are overweight, lose weight as told by your health care provider. ? Losing 10% of your body weight may improve symptoms. ? Your health care provider can determine how much weight loss is best for you and can help you lose weight safely.  Keep all follow-up visits as told by your health care provider. This is important. Contact a health care provider if:  Your symptoms do not get better with medicine.  You develop new symptoms. This information is not intended to replace advice given to  you by your health care provider. Make sure you discuss any questions you have with your health care provider. Document Released: 01/21/2005 Document Revised: 05/25/2016 Document Reviewed: 03/14/2016 Elsevier Interactive Patient Education  [employer].

## 2017-09-06 ENCOUNTER — Encounter: Payer: Self-pay | Admitting: Women's Health

## 2017-09-06 ENCOUNTER — Other Ambulatory Visit: Payer: Self-pay | Admitting: Women's Health

## 2017-09-06 LAB — CYTOLOGY - PAP
CHLAMYDIA, DNA PROBE: NEGATIVE
DIAGNOSIS: NEGATIVE
Neisseria Gonorrhea: NEGATIVE

## 2017-09-06 MED ORDER — METRONIDAZOLE 500 MG PO TABS: 500.0000 mg | ORAL_TABLET | Freq: Two times a day (BID) | ORAL | 0 refills | Status: DC

## 2017-12-08 ENCOUNTER — Ambulatory Visit: Payer: Medicaid Other | Admitting: Women's Health

## 2018-01-17 ENCOUNTER — Ambulatory Visit: Payer: Medicaid Other | Admitting: Family Medicine

## 2018-01-17 ENCOUNTER — Encounter: Payer: Self-pay | Admitting: Family Medicine

## 2018-01-17 VITALS — Temp 98.1°F | Ht 66.0 in | Wt 196.6 lb

## 2018-01-17 DIAGNOSIS — A084 Viral intestinal infection, unspecified: Secondary | ICD-10-CM | POA: Diagnosis not present

## 2018-01-17 MED ORDER — ONDANSETRON HCL 8 MG PO TABS: 8.0000 mg | ORAL_TABLET | Freq: Three times a day (TID) | ORAL | 1 refills | Status: DC | PRN

## 2018-01-17 NOTE — Patient Instructions (Signed)

## 2018-01-17 NOTE — Progress Notes (Signed)
   Subjective:    Patient ID: Paula Houston, female    DOB: 1996/06/23, 22 y.o.   MRN: [id number]  Emesis   This is a new problem. The current episode started today. Associated symptoms include abdominal pain. Pertinent negatives include no chest pain, coughing or diarrhea. Associated symptoms comments: Nausea and fatigue.   Daughter was vomiting on Sunday Significant nausea vomiting hit her this morning no diarrhea no wheezing difficulty breathing PMH benign  Review of Systems  Constitutional: Negative for activity change and appetite change.  HENT: Negative for congestion and rhinorrhea.   Respiratory: Negative for cough.   Cardiovascular: Negative for chest pain.  Gastrointestinal: Positive for abdominal pain, nausea and vomiting. Negative for diarrhea.  Skin: Negative for color change.  Neurological: Negative for weakness.  Psychiatric/Behavioral: Negative for confusion.       Objective:   Physical Exam  Constitutional: She appears well-nourished. No distress.  HENT:  Head: Normocephalic.  Right Ear: External ear normal.  Left Ear: External ear normal.  Eyes: Right eye exhibits no discharge. Left eye exhibits no discharge.  Neck: No tracheal deviation present.  Cardiovascular: Normal rate, regular rhythm and normal heart sounds.  No murmur heard. Pulmonary/Chest: Effort normal and breath sounds normal. No respiratory distress. She has no wheezes. She has no rales.  Abdominal: Soft. She exhibits no distension. There is no tenderness. There is no rebound.  Musculoskeletal: She exhibits no edema.  Lymphadenopathy:    She has no cervical adenopathy.  Neurological: She is alert.  Psychiatric: Her behavior is normal.  Vitals reviewed.    Mucous membranes moist not tachycardic     Assessment & Plan:  Viral gastroenteritis Zofran as directed Follow-up if progressive troubles Warning signs discussed Lab work not indicated

## 2018-01-24 ENCOUNTER — Ambulatory Visit: Payer: Medicaid Other | Admitting: Family Medicine

## 2018-01-24 ENCOUNTER — Encounter: Payer: Self-pay | Admitting: Family Medicine

## 2018-01-24 VITALS — BP 110/78 | Temp 98.1°F | Ht 66.0 in | Wt 193.0 lb

## 2018-01-24 DIAGNOSIS — J329 Chronic sinusitis, unspecified: Secondary | ICD-10-CM | POA: Diagnosis not present

## 2018-01-24 DIAGNOSIS — I889 Nonspecific lymphadenitis, unspecified: Secondary | ICD-10-CM

## 2018-01-24 MED ORDER — PROMETHAZINE HCL 25 MG PO TABS: 25.0000 mg | ORAL_TABLET | Freq: Three times a day (TID) | ORAL | 0 refills | Status: DC | PRN

## 2018-01-24 MED ORDER — CEFPROZIL 500 MG PO TABS: 500.0000 mg | ORAL_TABLET | Freq: Two times a day (BID) | ORAL | 0 refills | Status: DC

## 2018-01-24 NOTE — Progress Notes (Signed)
   Subjective:    Patient ID: Paula Houston, female    DOB: 1995-11-22, 22 y.o.   MRN: [id number]  HPI Patient is here today with complaints of a sore throat,and nausea,vomiting,cough,headache,fever since Sunday she has been taking nyquil,dayquil and benadryl and nausea medication.  Seen last wk for stomich viral g I challenges   Frontal headache and cong   Now has oved into resp symtoms   Freezing and chills  No sig cong , feels cong I the xhest    bad cest pain  ''    Review of Systems No headache, no major weight loss or weight gain, no chest pain no back pain abdominal pain no change in bowel habits complete ROS otherwise negative     Objective:   Physical Exam  Alert vitals stable, NAD. Blood pressure good on repeat. HEENT normal.  Positive tender cervical lymph nodes normal. Lungs clear. Heart regular rate and rhythm.Patient dated inflamed tonsils otherwise     Assessment & Plan:  Impression exudative tonsillitis plus cervical lymphadenitis discussed at length.  Initiated symptom care discussed

## 2018-02-14 ENCOUNTER — Encounter: Payer: Self-pay | Admitting: Family Medicine

## 2018-02-14 ENCOUNTER — Ambulatory Visit: Payer: Self-pay | Admitting: Family Medicine

## 2018-02-14 VITALS — BP 112/72 | Temp 98.3°F | Ht 66.0 in | Wt 196.0 lb

## 2018-02-14 DIAGNOSIS — J029 Acute pharyngitis, unspecified: Secondary | ICD-10-CM

## 2018-02-14 MED ORDER — AZITHROMYCIN 250 MG PO TABS: ORAL_TABLET | ORAL | 0 refills | Status: DC

## 2018-02-14 NOTE — Progress Notes (Signed)
   Subjective:    Patient ID: Paula Houston, female    DOB: 1995/10/22, 23 y.o.   MRN: [id number]  HPI Patient arrives with swollen tonsils. Patient was seen a few weeks ago for execudative tonsillitis and was getting better but now the symptoms have returned. This patient was originally seen a couple weeks ago felt to have upper respiratory illness was treated with antibiotics and instructed to follow-up if not doing better patient relates a lot of fatigue persistent sore throat denies headaches denies sweats chills wheezing difficulty breathing denies weight loss.  Review of Systems Please see above no cough no shortness of breath no vomiting no rash no severe headaches no neck stiffness no apnea eardrums were normal she makes good eye contact interactive not toxic    Objective:   Physical Exam Throat enlarged tonsils with some exudate slight amount of anterior adenopathy neck is supple lungs clear respiratory rate is normal heart is regular no murmurs skin warm dry airway overall good       Assessment & Plan:  Persistent sore throat We discussed the possibility of mono and doing a CBC and mono antibodies Patient does not have insurance she defers on this testing We will call her in Zithromax Patient was told if she is not back to 100% within 10 days she needs to notify us would want to do lab work She understands this She agrees to contact us sooner if any problems

## 2018-03-15 ENCOUNTER — Telehealth: Payer: Self-pay | Admitting: Family Medicine

## 2018-03-15 MED ORDER — AMOXICILLIN 500 MG PO CAPS: 500.0000 mg | ORAL_CAPSULE | Freq: Three times a day (TID) | ORAL | 0 refills | Status: DC

## 2018-03-15 NOTE — Telephone Encounter (Signed)
Contacted patient. She has no fever that she knows of, sore throat went away and came back last night.

## 2018-03-15 NOTE — Telephone Encounter (Signed)
Prescription sent electronically to pharmacy. Patient notified. 

## 2018-03-15 NOTE — Telephone Encounter (Signed)
Patient was seen on 5/7 with sore throat,URI and given antibiotic for 10 days.She states finished it and now her sore throat is back.Can you call in something else or will she need to be seen again. [facility]- [address].

## 2018-03-15 NOTE — Telephone Encounter (Signed)
Amoxil 500 mg 1 3 times daily for 10 days if ongoing troubles recommend follow-up office visit

## 2018-05-08 ENCOUNTER — Ambulatory Visit (INDEPENDENT_AMBULATORY_CARE_PROVIDER_SITE_OTHER): Payer: Self-pay | Admitting: Women's Health

## 2018-05-08 ENCOUNTER — Other Ambulatory Visit: Payer: Self-pay

## 2018-05-08 ENCOUNTER — Encounter: Payer: Self-pay | Admitting: Women's Health

## 2018-05-08 VITALS — BP 110/61 | HR 76 | Ht 66.0 in | Wt 200.0 lb

## 2018-05-08 DIAGNOSIS — Z3202 Encounter for pregnancy test, result negative: Secondary | ICD-10-CM

## 2018-05-08 DIAGNOSIS — N914 Secondary oligomenorrhea: Secondary | ICD-10-CM

## 2018-05-08 DIAGNOSIS — E282 Polycystic ovarian syndrome: Secondary | ICD-10-CM

## 2018-05-08 DIAGNOSIS — Z30011 Encounter for initial prescription of contraceptive pills: Secondary | ICD-10-CM

## 2018-05-08 LAB — POCT URINE PREGNANCY: PREG TEST UR: NEGATIVE

## 2018-05-08 MED ORDER — NORGESTIMATE-ETH ESTRADIOL 0.25-35 MG-MCG PO TABS: 1.0000 | ORAL_TABLET | Freq: Every day | ORAL | 11 refills | Status: DC

## 2018-05-08 NOTE — Patient Instructions (Signed)
Oral Contraception Use Oral contraceptive pills (OCPs) are medicines taken to prevent pregnancy. OCPs work by preventing the ovaries from releasing eggs. The hormones in OCPs also cause the cervical mucus to thicken, preventing the sperm from entering the uterus. The hormones also cause the uterine lining to become thin, not allowing a fertilized egg to attach to the inside of the uterus. OCPs are highly effective when taken exactly as prescribed. However, OCPs do not prevent sexually transmitted diseases (STDs). Safe sex practices, such as using condoms along with an OCP, can help prevent STDs. Before taking OCPs, you may have a physical exam and Pap test. Your health care provider may also order blood tests if necessary. Your health care provider will make sure you are a good candidate for oral contraception. Discuss with your health care provider the possible side effects of the OCP you may be prescribed. When starting an OCP, it can take 2 to 3 months for the body to adjust to the changes in hormone levels in your body. How to take oral contraceptive pills Your health care provider may advise you on how to start taking the first cycle of OCPs. Otherwise, you can:  Start on day 1 of your menstrual period. You will not need any backup contraceptive protection with this start time.  Start on the first Sunday after your menstrual period or the day you get your prescription. In these cases, you will need to use backup contraceptive protection for the first week.  Start the pill at any time of your cycle. If you take the pill within 5 days of the start of your period, you are protected against pregnancy right away. In this case, you will not need a backup form of birth control. If you start at any other time of your menstrual cycle, you will need to use another form of birth control for 7 days. If your OCP is the type called a minipill, it will protect you from pregnancy after taking it for 2 days (48  hours).  After you have started taking OCPs:  If you forget to take 1 pill, take it as soon as you remember. Take the next pill at the regular time.  If you miss 2 or more pills, call your health care provider because different pills have different instructions for missed doses. Use backup birth control until your next menstrual period starts.  If you use a 28-day pack that contains inactive pills and you miss 1 of the last 7 pills (pills with no hormones), it will not matter. Throw away the rest of the non-hormone pills and start a new pill pack.  No matter which day you start the OCP, you will always start a new pack on that same day of the week. Have an extra pack of OCPs and a backup contraceptive method available in case you miss some pills or lose your OCP pack. Follow these instructions at home:  Do not smoke.  Always use a condom to protect against STDs. OCPs do not protect against STDs.  Use a calendar to mark your menstrual period days.  Read the information and directions that came with your OCP. Talk to your health care provider if you have questions. Contact a health care provider if:  You develop nausea and vomiting.  You have abnormal vaginal discharge or bleeding.  You develop a rash.  You miss your menstrual period.  You are losing your hair.  You need treatment for mood swings or depression.  You   get dizzy when taking the OCP.  You develop acne from taking the OCP.  You become pregnant. Get help right away if:  You develop chest pain.  You develop shortness of breath.  You have an uncontrolled or severe headache.  You develop numbness or slurred speech.  You develop visual problems.  You develop pain, redness, and swelling in the legs. This information is not intended to replace advice given to you by your health care provider. Make sure you discuss any questions you have with your health care provider. Document Released: 09/16/2011 Document  Revised: 03/04/2016 Document Reviewed: 03/18/2013 Elsevier Interactive Patient Education  2017 Elsevier Inc.  

## 2018-05-08 NOTE — Progress Notes (Signed)
   GYN VISIT Patient name: Paula Houston MRN [id number]  Date of birth: 1996/10/07 Chief Complaint:   discuss bc  History of Present Illness:   Paula Houston is a 22 y.o. G26P2002 Caucasian female being seen today to discuss birth control. Was started on desogestrel/EE 81mcg COCs in Nov d/t PCOS w/ irregular periods, states after started taking them became nauseas and bad headaches, stopped them and sx resolved. Still hasn't had period since stopping. Doesn't want pregnancy right now.       No LMP recorded (lmp unknown). (Menstrual status: Irregular Periods). Last pap 09/05/17. Results were:  normal Review of Systems:   Pertinent items are noted in HPI Denies fever/chills, dizziness, headaches, visual disturbances, fatigue, shortness of breath, chest pain, abdominal pain, vomiting, abnormal vaginal discharge/itching/odor/irritation, problems with periods, bowel movements, urination, or intercourse unless otherwise stated above.  Pertinent History Reviewed:  Reviewed past medical,surgical, social, obstetrical and family history.  Reviewed problem list, medications and allergies. Physical Assessment:   Vitals:   05/08/18 1538  BP: 110/61  Pulse: 76  Weight: 200 lb (90.7 kg)  Height: 5\' 6"  (1.676 m)  Body mass index is 32.28 kg/m.       Physical Examination:   General appearance: alert, well appearing, and in no distress  Mental status: alert, oriented to person, place, and time  Skin: warm & dry   Cardiovascular: normal heart rate noted  Respiratory: normal respiratory effort, no distress  Abdomen: soft, non-tender   Pelvic: examination not indicated  Extremities: no edema   Results for orders placed or performed in visit on 05/08/18 (from the past 24 hour(s))  POCT urine pregnancy   Collection Time: 05/08/18  3:40 PM  Result Value Ref Range   Preg Test, Ur Negative Negative    Assessment & Plan:  1) Contraception management> will try sprintec as it's on $4 list at  [facility], pt self-pay, if any sx, let me know  2) PCOS w/ oligomenorrhea  Meds:  Meds ordered this encounter  Medications  . norgestimate-ethinyl estradiol (ORTHO-CYCLEN,SPRINTEC,PREVIFEM) 0.25-35 MG-MCG tablet    Sig: Take 1 tablet by mouth daily.    Dispense:  1 Package    Refill:  11    Order Specific Question:   Supervising Provider    Answer:   Tania Ade H [[id number]]    Orders Placed This Encounter  Procedures  . POCT urine pregnancy    Return in about 3 months (around 08/08/2018) for F/U.  [place], [facility] 05/08/2018 4:02 PM

## 2018-05-25 ENCOUNTER — Other Ambulatory Visit: Payer: Self-pay | Admitting: Adult Health

## 2018-05-25 MED ORDER — NORETHIN-ETH ESTRAD-FE BIPHAS 1 MG-10 MCG / 10 MCG PO TABS: 1.0000 | ORAL_TABLET | Freq: Every day | ORAL | 11 refills | Status: DC

## 2018-05-25 NOTE — Progress Notes (Signed)
Will rx lo leostrin to try feels sick with sprintec.

## 2018-05-26 ENCOUNTER — Telehealth: Payer: Self-pay | Admitting: Women's Health

## 2018-05-26 NOTE — Telephone Encounter (Signed)
Patient states she has not contacted [facility] to see if BCP is cheaper.  Advised to call. Verbalized understanding.

## 2018-05-26 NOTE — Telephone Encounter (Signed)
Patient called, stated that the most recent birth control called in was called to [facility] and she has been using [facility] in [place].  At [facility], it's $150 for one month.  She wants to know if it is cheaper at [facility].  [phone]

## 2018-05-30 ENCOUNTER — Telehealth: Payer: Self-pay | Admitting: *Deleted

## 2018-05-30 NOTE — Telephone Encounter (Signed)
Pt called stating that the LoLoestrin she was prescribed is too expensive. Offered pt a prescription savings card that she can try. Pt to come by office tomorrow to pick up.

## 2018-06-02 ENCOUNTER — Telehealth: Payer: Self-pay | Admitting: *Deleted

## 2018-06-02 MED ORDER — NORETHIN ACE-ETH ESTRAD-FE 1-20 MG-MCG PO TABS: 1.0000 | ORAL_TABLET | Freq: Every day | ORAL | 11 refills | Status: DC

## 2018-06-02 NOTE — Telephone Encounter (Signed)
Pt aware that junel sent, but may try card again for lo loestrin

## 2018-06-08 ENCOUNTER — Telehealth: Payer: Self-pay | Admitting: *Deleted

## 2018-06-08 NOTE — Telephone Encounter (Signed)
Patient states she is still bleeding since starting the Junel on Monday.  Informed patient to give it a month or so for her body to adjust to the pill.  Verbalized understanding.

## 2018-08-08 ENCOUNTER — Ambulatory Visit: Payer: Self-pay | Admitting: Women's Health

## 2018-12-13 ENCOUNTER — Telehealth: Payer: Self-pay | Admitting: *Deleted

## 2018-12-13 MED ORDER — NORETHIN ACE-ETH ESTRAD-FE 1-20 MG-MCG PO TABS: 1.0000 | ORAL_TABLET | Freq: Every day | ORAL | 11 refills | Status: DC

## 2018-12-13 NOTE — Telephone Encounter (Signed)
Patient called stating she needs her birth control refilled, patient states when the nurse sent the prescription over last time the patient didn't get it for a couple days and the pharmacy told her to call our office to have it re sent. Please advise [phone]  [place] [facility]

## 2018-12-13 NOTE — Telephone Encounter (Signed)
Refilled OCs 

## 2019-02-01 ENCOUNTER — Telehealth: Payer: Self-pay | Admitting: Women's Health

## 2019-02-01 NOTE — Telephone Encounter (Signed)
Pt states she had a lump come up on her vagina and she is requesting to come in to have this examined.

## 2019-02-01 NOTE — Telephone Encounter (Signed)
Patient states she has noticed a bump on her labia that she tried to pop but nothing happened.  Thought is was going away but it's still there.  Advised we would need to see her to evaluate.  Placed on schedule for tomorrow.   Patient informed that we are not allowing visitors or children to come to appointments at this time. Patient denies any contact with anyone suspected or confirmed of having COVID-19. Pt denies fever, cough, sob, muscle pain, diarrhea, rash, vomiting, abdominal pain, red eye, weakness, bruising or bleeding, joint pain or severe headache.

## 2019-02-02 ENCOUNTER — Ambulatory Visit (INDEPENDENT_AMBULATORY_CARE_PROVIDER_SITE_OTHER): Payer: Self-pay | Admitting: Adult Health

## 2019-02-02 ENCOUNTER — Encounter: Payer: Self-pay | Admitting: Adult Health

## 2019-02-02 ENCOUNTER — Other Ambulatory Visit: Payer: Self-pay

## 2019-02-02 VITALS — BP 124/76 | HR 91 | Temp 98.7°F | Ht 66.0 in | Wt 196.0 lb

## 2019-02-02 DIAGNOSIS — L739 Follicular disorder, unspecified: Secondary | ICD-10-CM

## 2019-02-02 MED ORDER — SULFAMETHOXAZOLE-TRIMETHOPRIM 800-160 MG PO TABS: 1.0000 | ORAL_TABLET | Freq: Two times a day (BID) | ORAL | 1 refills | Status: DC

## 2019-02-02 NOTE — Patient Instructions (Signed)

## 2019-02-02 NOTE — Progress Notes (Addendum)
  Subjective:     Patient ID: Paula Houston, female   DOB: 14-Nov-1995, 23 y.o.   MRN: [id number]  HPI Paula Houston is a 23 year old white female, G2P2, in complaining of bump on left labia.Has been there about a week and is actually better now.She thinks it is a hair bump, has had in past.  PCP is Mickie Hillier   Review of Systems  Has bump left labia, is actually better now +cramping just started back on OCs.    Reviewed past medical,surgical, social and family history. Reviewed medications and allergies.  Objective:   Physical Exam BP 124/76 (BP Location: Left Arm, Patient Position: Sitting, Cuff Size: Normal)   Pulse 91   Temp 98.7 F (37.1 C)   Ht 5\' 6"  (1.676 m)   Wt 196 lb (88.9 kg)   LMP 01/22/2019   BMI 31.64 kg/m   Skin warm and dry.Pelvic: external genitalia is normal in appearance, except on left labia has pea sized thickness under th skin, and appears to be healing hair bump, non tender today. Fall is low. Examination chaperoned by [name] LPN.     Assessment:     1. Folliculitis -use warm moist compresses prn -Clip, do not shave -Use bar soap, not shower gel -Will give septra ds Meds ordered this encounter  Medications  . sulfamethoxazole-trimethoprim (BACTRIM DS) 800-160 MG tablet    Sig: Take 1 tablet by mouth 2 (two) times daily. Take 1 bid    Dispense:  28 tablet    Refill:  1    Order Specific Question:   Supervising Provider    Answer:   Tania Ade H [[id number]]  -review handout on folliculitis     Plan:     F/U prn

## 2019-07-17 ENCOUNTER — Other Ambulatory Visit: Payer: Self-pay

## 2019-07-17 ENCOUNTER — Encounter: Payer: Self-pay | Admitting: *Deleted

## 2019-07-17 ENCOUNTER — Other Ambulatory Visit (INDEPENDENT_AMBULATORY_CARE_PROVIDER_SITE_OTHER): Payer: Self-pay | Admitting: *Deleted

## 2019-07-17 VITALS — BP 105/63 | HR 80

## 2019-07-17 DIAGNOSIS — R319 Hematuria, unspecified: Secondary | ICD-10-CM

## 2019-07-17 DIAGNOSIS — R300 Dysuria: Secondary | ICD-10-CM

## 2019-07-17 LAB — POCT URINALYSIS DIPSTICK OB
Glucose, UA: NEGATIVE
Ketones, UA: NEGATIVE
Leukocytes, UA: NEGATIVE
Nitrite, UA: NEGATIVE
POC,PROTEIN,UA: NEGATIVE

## 2019-07-17 NOTE — Progress Notes (Signed)
Patient ID: Paula Houston, female   DOB: 1996/10/06, 23 y.o.   MRN: [id number] Attestation of Attending Supervision of Nursing Visit Encounter: Evaluation and management procedures were performed by the nursing staff under my supervision and collaboration.  I have reviewed the nurse's note and chart, and I agree with the management and plan.  Jacelyn Grip MD Attending Physician for the Center for [facility] 07/17/2019 4:20 PM

## 2019-07-17 NOTE — Progress Notes (Addendum)
   NURSE VISIT- UTI SYMPTOMS   SUBJECTIVE:  Paula Houston is a 23 y.o. G41P2002 female here for UTI symptoms. She is a GYN patient. She reports hematuria.  OBJECTIVE:  BP 105/63 (BP Location: Right Arm, Patient Position: Sitting, Cuff Size: Normal)   Pulse 80   Appears well, in no apparent distress  Results for orders placed or performed in visit on 07/17/19 (from the past 24 hour(s))  POC Urinalysis Dipstick OB   Collection Time: 07/17/19  3:44 PM  Result Value Ref Range   Color, UA     Clarity, UA     Glucose, UA Negative Negative   Bilirubin, UA     Ketones, UA neg    Spec Grav, UA     Blood, UA mod    pH, UA     POC,PROTEIN,UA Negative Negative, Trace, Small (1+), Moderate (2+), Large (3+), 4+   Urobilinogen, UA     Nitrite, UA neg    Leukocytes, UA Negative Negative   Appearance     Odor      ASSESSMENT: GYN patient with UTI symptoms and negative nitrites  PLAN: Discussed with Dr. Elonda Husky   Rx sent by provider today: No Urine culture sent Call or return to clinic prn if these symptoms worsen or fail to improve as anticipated. Follow-up: as needed   Rhea Thrun, Celene Squibb  07/17/2019 3:48 PM

## 2019-07-18 ENCOUNTER — Telehealth: Payer: Self-pay | Admitting: *Deleted

## 2019-07-18 NOTE — Telephone Encounter (Signed)
Please disregard previous telephone encounter. Patient had urine culture ordered yesterday but now having fever and chills. Wants rx sent in.

## 2019-07-18 NOTE — Telephone Encounter (Signed)
Pt seen yesterday for self swab. States that today she woke up with fever and chills today. Waiting on swab results but wants to see if we can send in abx for her. Urine was negative and not sent.

## 2019-07-20 ENCOUNTER — Telehealth: Payer: Self-pay | Admitting: *Deleted

## 2019-07-20 LAB — URINE CULTURE

## 2019-07-20 MED ORDER — NITROFURANTOIN MONOHYD MACRO 100 MG PO CAPS: 100.0000 mg | ORAL_CAPSULE | Freq: Two times a day (BID) | ORAL | 0 refills | Status: DC

## 2019-07-20 NOTE — Telephone Encounter (Signed)
Urine culture did not grow anything but staph and Dr Elonda Husky statied it was skin contaminants.  She is still having urinary frequency and pain along with blood in urine and lower back pain and is requesting an antibiotic.

## 2019-07-20 NOTE — Addendum Note (Signed)
Addended by: Derrek Monaco A on: 07/20/2019 02:12 PM   Modules accepted: Orders

## 2019-07-20 NOTE — Telephone Encounter (Signed)
Pt aware sent Rx for Macrobid to [facility]

## 2019-09-18 ENCOUNTER — Encounter: Payer: Self-pay | Admitting: Women's Health

## 2019-09-18 ENCOUNTER — Telehealth (INDEPENDENT_AMBULATORY_CARE_PROVIDER_SITE_OTHER): Payer: Self-pay | Admitting: Women's Health

## 2019-09-18 ENCOUNTER — Other Ambulatory Visit: Payer: Self-pay

## 2019-09-18 DIAGNOSIS — N911 Secondary amenorrhea: Secondary | ICD-10-CM

## 2019-09-18 DIAGNOSIS — Z30015 Encounter for initial prescription of vaginal ring hormonal contraceptive: Secondary | ICD-10-CM

## 2019-09-18 DIAGNOSIS — E282 Polycystic ovarian syndrome: Secondary | ICD-10-CM

## 2019-09-18 MED ORDER — MEDROXYPROGESTERONE ACETATE 10 MG PO TABS: 10.0000 mg | ORAL_TABLET | Freq: Every day | ORAL | 0 refills | Status: DC

## 2019-09-18 MED ORDER — ETONOGESTREL-ETHINYL ESTRADIOL 0.12-0.015 MG/24HR VA RING: VAGINAL_RING | VAGINAL | 3 refills | Status: DC

## 2019-09-18 NOTE — Progress Notes (Signed)
TELEHEALTH VIRTUAL GYN VISIT ENCOUNTER NOTE Patient name: Paula Houston MRN [id number]  Date of birth: Oct 21, 1995  I connected with patient on 09/18/19 at  3:50 PM EST by MyChart video  and verified that I am speaking with the correct person using two identifiers.  Due to COVID-19 recommendations, pt is not currently in the office.    I discussed the limitations, risks, security and privacy concerns of performing an evaluation and management service by telephone and the availability of in person appointments. I also discussed with the patient that there may be a patient responsible charge related to this service. The patient expressed understanding and agreed to proceed.   Chief Complaint:   Discuss birth control options  History of Present Illness:   Paula Houston is a 23 y.o. G70P2002 Caucasian female being evaluated today to discuss periods. Has PCOS, was on couple different COCs, stopped Junel in February, no period since.  Hasn't taken HPT. Does not want another pregnancy right now. Doesn't want COCs, can't remember to take them. Gained weight on depo. Wants something where she will have a period.  No LMP recorded. (Menstrual status: Irregular Periods). The current method of family planning is none.  Last pap 09/05/17. Results were:  normal Review of Systems:   Pertinent items are noted in HPI Denies fever/chills, dizziness, headaches, visual disturbances, fatigue, shortness of breath, chest pain, abdominal pain, vomiting, abnormal vaginal discharge/itching/odor/irritation, problems with periods, bowel movements, urination, or intercourse unless otherwise stated above.  Pertinent History Reviewed:  Reviewed past medical,surgical, social, obstetrical and family history.  Reviewed problem list, medications and allergies. Physical Assessment:  There were no vitals filed for this visit.There is no height or weight on file to calculate BMI.       Physical Examination:   General:   Alert, oriented and cooperative.   Mental Status: Normal mood and affect perceived. Normal judgment and thought content.  Physical exam deferred due to nature of the encounter  No results found for this or any previous visit (from the past 24 hour(s)).  Assessment & Plan:  1) PCOS w/ secondary amenorrhea> take HPT, if neg take provera 10mg  x 10d- if doesn't bleed let me know; if bleeds, start nuvaring. F/U 23mths  Meds:  Meds ordered this encounter  Medications  . etonogestrel-ethinyl estradiol (NUVARING) 0.12-0.015 MG/24HR vaginal ring    Sig: Insert vaginally and leave in place for 3 consecutive weeks, then remove for 1 week.    Dispense:  3 each    Refill:  3    Order Specific Question:   Supervising Provider    Answer:   Elonda Husky, LUTHER H [[id number]]  . medroxyPROGESTERone (PROVERA) 10 MG tablet    Sig: Take 1 tablet (10 mg total) by mouth daily.    Dispense:  10 tablet    Refill:  0    Order Specific Question:   Supervising Provider    Answer:   Elonda Husky, LUTHER H [[id number]]    No orders of the defined types were placed in this encounter.   I discussed the assessment and treatment plan with the patient. The patient was provided an opportunity to ask questions and all were answered. The patient agreed with the plan and demonstrated an understanding of the instructions.   The patient was advised to call back or seek an in-person evaluation/go to the ED if the symptoms worsen or if the condition fails to improve as anticipated.  I provided 20 minutes of non-face-to-face time during this  encounter.   Return in about 3 months (around 12/17/2019) for F/U, MyChart Video w/ me.  Roma Schanz CNM, [facility] 09/18/2019 4:59 PM

## 2019-11-07 ENCOUNTER — Encounter: Payer: Self-pay | Admitting: Family Medicine

## 2019-11-08 ENCOUNTER — Encounter: Payer: Self-pay | Admitting: Family Medicine

## 2019-12-17 ENCOUNTER — Telehealth (INDEPENDENT_AMBULATORY_CARE_PROVIDER_SITE_OTHER): Payer: Self-pay | Admitting: Women's Health

## 2019-12-17 ENCOUNTER — Other Ambulatory Visit: Payer: Self-pay

## 2019-12-17 ENCOUNTER — Encounter: Payer: Self-pay | Admitting: Women's Health

## 2019-12-17 VITALS — Ht 66.0 in | Wt 174.0 lb

## 2019-12-17 DIAGNOSIS — K649 Unspecified hemorrhoids: Secondary | ICD-10-CM

## 2019-12-17 DIAGNOSIS — Z3044 Encounter for surveillance of vaginal ring hormonal contraceptive device: Secondary | ICD-10-CM

## 2019-12-17 MED ORDER — HYDROCORTISONE (PERIANAL) 2.5 % EX CREA: 1.0000 "application " | TOPICAL_CREAM | Freq: Two times a day (BID) | CUTANEOUS | 3 refills | Status: DC

## 2019-12-17 NOTE — Progress Notes (Signed)
   TELEHEALTH VIRTUAL GYN VISIT ENCOUNTER NOTE Patient name: Paula Houston MRN [id number]  Date of birth: 1996/03/09  I connected with patient on 12/17/19 at  3:50 PM EST by MyChart video  and verified that I am speaking with the correct person using two identifiers.  Due to COVID-19 recommendations, pt is not currently in the office.    I discussed the limitations, risks, security and privacy concerns of performing an evaluation and management service by telephone and the availability of in person appointments. I also discussed with the patient that there may be a patient responsible charge related to this service. The patient expressed understanding and agreed to proceed.   Chief Complaint:   Follow-up (on NuvaRing)  History of Present Illness:   Paula Houston is a 24 y.o. G27P2002 Caucasian female being evaluated today for f/u NuvaRing rx'd 09/18/19. Has PCOS, hadn't had a period since Feb 2020. She was given a provera challenge, she did bleed, then she started nuvaring as rx'd. States it's her favorite birth control so far! Has regular periods. No problems. Does have hemorrhoid that is bothering her. Some constipation. Some occasional bleeding. Tried OTC hemorrhoid cream and didn't really help.   Patient's last menstrual period was 11/30/2019. The current method of family planning is NuvaRing vaginal inserts.  Last pap 09/05/17. Results were:  normal Review of Systems:   Pertinent items are noted in HPI Denies fever/chills, dizziness, headaches, visual disturbances, fatigue, shortness of breath, chest pain, abdominal pain, vomiting, abnormal vaginal discharge/itching/odor/irritation, problems with periods, bowel movements, urination, or intercourse unless otherwise stated above.  Pertinent History Reviewed:  Reviewed past medical,surgical, social, obstetrical and family history.  Reviewed problem list, medications and allergies. Physical Assessment:   Vitals:   12/17/19 1557    Weight: 174 lb (78.9 kg)  Height: 5\' 6"  (1.676 m)  Body mass index is 28.08 kg/m.       Physical Examination:   General:  Alert, oriented and cooperative.   Mental Status: Normal mood and affect perceived. Normal judgment and thought content.  Physical exam deferred due to nature of the encounter  No results found for this or any previous visit (from the past 24 hour(s)).  Assessment & Plan:  1) Contraception & period managment> doing great w/ NuvaRing, has regular periods!  2) Hemorrhoids w/ occ constipation> gave printed info on AVS for constipation, rx anusol hc, can come pick up TUCKS pads sample, let me know if doesn't improve  Meds:  Meds ordered this encounter  Medications  . hydrocortisone (ANUSOL-HC) 2.5 % rectal cream    Sig: Place 1 application rectally 2 (two) times daily.    Dispense:  30 g    Refill:  3    Order Specific Question:   Supervising Provider    Answer:   Elonda Husky, LUTHER H [[id number]]    No orders of the defined types were placed in this encounter.   I discussed the assessment and treatment plan with the patient. The patient was provided an opportunity to ask questions and all were answered. The patient agreed with the plan and demonstrated an understanding of the instructions.   The patient was advised to call back or seek an in-person evaluation/go to the ED if the symptoms worsen or if the condition fails to improve as anticipated.  I provided 15 minutes of non-face-to-face time during this encounter.   Return for Nov for , Pap & physical.  Roma Schanz CNM, [facility] 12/17/2019 4:20 PM

## 2019-12-17 NOTE — Patient Instructions (Signed)

## 2020-06-23 ENCOUNTER — Telehealth: Payer: Self-pay | Admitting: Family Medicine

## 2020-06-23 ENCOUNTER — Ambulatory Visit: Payer: Self-pay | Admitting: Family Medicine

## 2020-06-23 NOTE — Telephone Encounter (Signed)
Pt states Saturday evening her taste was off and then this morning was having a dry cough, low grade fever. Advised pt if having sob, chest pain,  high fever, vomiting she should go to ED. Pt states she is feeling fine but would like an appt tomorrow to talk with doctor. Appt made.

## 2020-06-23 NOTE — Telephone Encounter (Signed)
FYI- patient tested positive for Covid today

## 2020-06-24 ENCOUNTER — Ambulatory Visit (INDEPENDENT_AMBULATORY_CARE_PROVIDER_SITE_OTHER): Payer: Self-pay | Admitting: Family Medicine

## 2020-06-24 VITALS — HR 80 | Temp 99.0°F | Resp 20

## 2020-06-24 DIAGNOSIS — U071 COVID-19: Secondary | ICD-10-CM

## 2020-06-24 NOTE — Patient Instructions (Addendum)
Covid 19 recommendations- It's symptomatic tx for viral illness. Take mucinex as directed, could change to delsym if needed.  Increase fluid intake. Vit C, vit D and zinc are recommended vitamins to take.  zinc dose of 75 mg-100mg . daily doses of vitamin D3 (1,000-4,000 IU), vitamin C (500 mg), and melatonin (0.3mg -2 mg each night).   Flonase for nasal congestion.  If not getting better with all this, then need to go to ER for further evaluation and check of vitals. Obtain pulse oximeter and if seeing numbers under 90% oxygenation then needing to go to ER. Can order from [facility] if needed or from pharmacy.  Pain or fever take 800mg  every 8hrs as needed.

## 2020-06-24 NOTE — Progress Notes (Signed)
Patient ID: Paula Houston, female    DOB: [birth year], 24 y.o.   MRN: [id number]   Chief Complaint  Patient presents with  . Covid Positive    Patient reports fever, chills, body aches, headaches and cough. Patient states she feels mucous or something in her throat that she is trying to get up but cough is dry. Chest started hurting last night and she is unsure if this is from the constant coughing or not. Tested positive yesterday.   Subjective:    HPI  Pt seen as tent visit. Pt seen for positive covid infection.  Tested positive yesterday. Having fever, bodyaches and coughing.  Throat discomfort.  Having some chest pain at night with coughing. Has been taking tylenol prn.  Medical History Paula Houston has a past medical history of Medical history non-contributory, Painful menstrual periods (06/04/2014), and PCOS (polycystic ovarian syndrome).   Outpatient Encounter Medications as of 06/24/2020  Medication Sig  . Acetaminophen (TYLENOL) 325 MG CAPS Take by mouth as needed.  . etonogestrel-ethinyl estradiol (NUVARING) 0.12-0.015 MG/24HR vaginal ring Insert vaginally and leave in place for 3 consecutive weeks, then remove for 1 week.  . hydrocortisone (ANUSOL-HC) 2.5 % rectal cream Place 1 application rectally 2 (two) times daily.  Marland Kitchen UNABLE TO FIND Truvy-daily (Patient not taking: Reported on 06/24/2020)   No facility-administered encounter medications on file as of 06/24/2020.     Review of Systems  Constitutional: Positive for chills and fever.  HENT: Positive for sore throat. Negative for congestion, ear pain, rhinorrhea, sinus pressure and sinus pain.   Eyes: Negative for pain, discharge and itching.  Respiratory: Positive for cough and chest tightness. Negative for shortness of breath.   Gastrointestinal: Negative for constipation, diarrhea, nausea and vomiting.  Musculoskeletal: Positive for myalgias.     Vitals Pulse 80   Temp 99 F (37.2 C)   Resp 20   SpO2 98%    Objective:   Physical Exam Vitals and nursing note reviewed.  Constitutional:      General: She is not in acute distress.    Appearance: Normal appearance. She is not ill-appearing or toxic-appearing.  HENT:     Head: Normocephalic and atraumatic.     Right Ear: Tympanic membrane, ear canal and external ear normal.     Left Ear: Tympanic membrane, ear canal and external ear normal.     Nose: Nose normal. No congestion or rhinorrhea.     Mouth/Throat:     Mouth: Mucous membranes are moist.     Pharynx: Oropharynx is clear. No oropharyngeal exudate or posterior oropharyngeal erythema.  Eyes:     Extraocular Movements: Extraocular movements intact.     Conjunctiva/sclera: Conjunctivae normal.     Pupils: Pupils are equal, round, and reactive to light.  Cardiovascular:     Rate and Rhythm: Normal rate and regular rhythm.     Pulses: Normal pulses.     Heart sounds: Normal heart sounds.  Pulmonary:     Effort: Pulmonary effort is normal. No respiratory distress.     Breath sounds: Normal breath sounds. No wheezing.  Musculoskeletal:     Cervical back: Normal range of motion.  Lymphadenopathy:     Cervical: No cervical adenopathy.  Skin:    General: Skin is warm and dry.     Findings: No rash.  Neurological:     General: No focal deficit present.     Mental Status: She is alert and oriented to person, place, and time.  Cranial Nerves: No cranial nerve deficit.  Psychiatric:        Mood and Affect: Mood normal.        Behavior: Behavior normal.      Assessment and Plan   1. COVID-19 virus infection   Covid 19 recommendations- It's symptomatic tx for viral illness. Take mucinex as directed, could change to delsym if needed.  Increase fluid intake. Vit C, vit D and zinc are recommended vitamins to take.  zinc dose of 75 mg-100mg . daily doses of vitamin D3 (1,000-4,000 IU), vitamin C (500 mg), and melatonin (0.3mg -2 mg each night). Flonase for nasal congestion.  If  not getting better with all this and sob/or worsening, then need to go to urgent care or ER for further evaluation and check of vitals. Obtain pulse oximeter and if seeing numbers under 90% oxygenation then needing to go to ER.  Can order from [facility] if needed or from pharmacy.   Pt voiced understanding.  F/u prn.

## 2020-06-30 ENCOUNTER — Telehealth: Payer: Self-pay | Admitting: Family Medicine

## 2020-06-30 MED ORDER — ONDANSETRON 4 MG PO TBDP: ORAL_TABLET | ORAL | 0 refills | Status: DC

## 2020-06-30 NOTE — Telephone Encounter (Signed)
Patient notified, script sent

## 2020-06-30 NOTE — Telephone Encounter (Signed)
Please advise 

## 2020-06-30 NOTE — Telephone Encounter (Signed)
Yes, pls give zofran 4mg  odt tablet, bid prn nausea, #10 tab ,no refills. Thx. Dr. Darene Lamer

## 2020-06-30 NOTE — Telephone Encounter (Signed)
Tested positive for Covid last Monday 13th, feeling better, no sob but today is having a lot of nausea.  Can we call in something for nausea? (No appts left for today)    [facility]

## 2020-07-03 ENCOUNTER — Telehealth: Payer: Self-pay | Admitting: Family Medicine

## 2020-07-03 NOTE — Telephone Encounter (Signed)
Mother notified and verbalized understanding.Patient stated she will contact the school and notify the school of the situation to see exactly what day her child can return.

## 2020-07-03 NOTE — Telephone Encounter (Signed)
Stay home for 14 days after your last contact with a person who has COVID-19. So it would be 14 days after husband tested positive 07/02/20. Thx, dr. Lovena Le

## 2020-07-03 NOTE — Telephone Encounter (Signed)
Pt is calling wanting to know about quarantine.   She tested positive 06/23/2020  Kids tested positive 06/25/2020.   Husband tested positive 07/02/2020.   She is wanting to know if they need to quarantine another 10 days from husbands positive.

## 2020-08-14 ENCOUNTER — Ambulatory Visit: Payer: Self-pay | Admitting: Women's Health

## 2020-09-09 ENCOUNTER — Ambulatory Visit (INDEPENDENT_AMBULATORY_CARE_PROVIDER_SITE_OTHER): Payer: Self-pay | Admitting: *Deleted

## 2020-09-09 ENCOUNTER — Encounter: Payer: Self-pay | Admitting: *Deleted

## 2020-09-09 ENCOUNTER — Other Ambulatory Visit: Payer: Self-pay

## 2020-09-09 VITALS — BP 145/84 | HR 104 | Ht 66.0 in | Wt 182.0 lb

## 2020-09-09 DIAGNOSIS — Z3201 Encounter for pregnancy test, result positive: Secondary | ICD-10-CM

## 2020-09-09 LAB — POCT URINE PREGNANCY: Preg Test, Ur: POSITIVE — AB

## 2020-09-09 NOTE — Progress Notes (Signed)
   NURSE VISIT- PREGNANCY CONFIRMATION   SUBJECTIVE:  Paula Houston is a 24 y.o. G59P2002 female at [redacted]w[redacted]d by certain LMP of Patient's last menstrual period was 07/31/2020. Here for pregnancy confirmation.  Home pregnancy test: positive x 11  She reports nausea.  She is not taking prenatal vitamins.    OBJECTIVE:  BP (!) 145/84 (BP Location: Left Arm, Patient Position: Sitting, Cuff Size: Normal)   Pulse (!) 104   Ht 5\' 6"  (1.676 m)   Wt 182 lb (82.6 kg)   LMP 07/31/2020   BMI 29.38 kg/m   Appears well, in no apparent distress OB History  Gravida Para Term Preterm AB Living  3 2 2  0 0 2  SAB TAB Ectopic Multiple Live Births  0 0 0 0 2    # Outcome Date GA Lbr Len/2nd Weight Sex Delivery Anes PTL Lv  3 Current           2 Term 12/25/16 [redacted]w[redacted]d  7 lb 8.8 oz (3.425 kg) F CS-LTranv Spinal  LIV  1 Term 12/29/10 [redacted]w[redacted]d  5 lb 14 oz (2.665 kg) M Vag-Spont EPI N LIV     Birth Comments: System Generated. Please review and update pregnancy details.    No results found for this or any previous visit (from the past 24 hour(s)).  ASSESSMENT: Positive pregnancy test, [redacted]w[redacted]d by LMP    PLAN: Schedule for dating ultrasound in 3 weeks Prenatal vitamins: plans to begin OTC ASAP   Nausea medicines: not currently needed   OB packet given: Yes  Paula Houston  09/09/2020 3:42 PM

## 2020-09-09 NOTE — Progress Notes (Signed)
Chart reviewed for nurse visit. Agree with plan of care.  Estill Dooms, NP 09/09/2020 5:38 PM

## 2020-09-12 ENCOUNTER — Telehealth: Payer: Self-pay | Admitting: *Deleted

## 2020-09-12 ENCOUNTER — Telehealth: Payer: Self-pay | Admitting: Women's Health

## 2020-09-12 NOTE — Telephone Encounter (Signed)
Patient states she has had COVID in September but is still having some residual symptoms, chills, etc. States she does have anxiety and thinks most of her symptoms as far as the chest tightness and cramping are coming from her anxiety.  She does try to do some relaxation techniques to try and calm herself down.  She is planning on switching to [employer].  Advised patient to continue to practice relaxation techniques but if symptoms do not get better or worsen, would need to go to the hospital.  Pt verbalized understanding.

## 2020-09-12 NOTE — Telephone Encounter (Signed)
Pt states cramping on & off for a couple days, low grade fever & chills started today, SOB w/walking, anxious  Please advise & call pt  (next avail is 12/10 w/Dr. Elonda Husky)

## 2020-09-17 ENCOUNTER — Other Ambulatory Visit: Payer: Self-pay

## 2020-09-17 ENCOUNTER — Other Ambulatory Visit (INDEPENDENT_AMBULATORY_CARE_PROVIDER_SITE_OTHER): Payer: Self-pay

## 2020-09-17 ENCOUNTER — Other Ambulatory Visit: Payer: Self-pay | Admitting: Obstetrics & Gynecology

## 2020-09-17 DIAGNOSIS — Z3A01 Less than 8 weeks gestation of pregnancy: Secondary | ICD-10-CM

## 2020-09-17 DIAGNOSIS — O3680X0 Pregnancy with inconclusive fetal viability, not applicable or unspecified: Secondary | ICD-10-CM

## 2020-09-17 NOTE — Progress Notes (Signed)
[place] 6+6 wks,single IUP with YS,FHR 105 bpm,crl 4.87 mm,normal right ovary,simple left ovarian cyst 4.5 x 1.4 x 2.7 cm

## 2020-09-26 ENCOUNTER — Other Ambulatory Visit: Payer: Self-pay

## 2020-10-06 ENCOUNTER — Telehealth: Payer: Self-pay | Admitting: Women's Health

## 2020-10-06 ENCOUNTER — Other Ambulatory Visit: Payer: Self-pay

## 2020-10-06 NOTE — Telephone Encounter (Signed)
Patient wanted know if she could be seen sooner than January 19 appt that's  already scheduled. Patient states that she's feeling sick & Zyrtec isn't working, per patient. Clinical staff will follow up with patient.

## 2020-10-07 ENCOUNTER — Other Ambulatory Visit: Payer: Self-pay | Admitting: Obstetrics & Gynecology

## 2020-10-07 MED ORDER — AMOXICILLIN-POT CLAVULANATE 875-125 MG PO TABS: 1.0000 | ORAL_TABLET | Freq: Two times a day (BID) | ORAL | 0 refills | Status: DC

## 2020-10-11 NOTE — L&D Delivery Note (Signed)
Paula Houston is a 25 y.o. female G69P2002 with IUP at 29w3dadmitted for spontaneous labor.  She progressed with AROM and pitocin augmentation to complete and pushed 2 times to deliver.  Cord clamping delayed by several minutes then clamped by CNM and cut by FOB.    Delivery Note At  a viable and healthy female was delivered via  (Presentation: Right Occiput Anterior).  APGAR: 8, 9; weight pending .   Placenta status: spontaneous, intact rapidly after baby with a gush of port wine blood and large adherent clot to back of placenta. Likely placenta abruption.  Cord: 3 vessels with the following complications: nuchal x1, delivered through and easily reduced after delivery   Anesthesia: epidural Episiotomy: n/a Lacerations: 2nd degree Suture Repair: 3.0 vicryl Est. Blood Loss (mL): 229 Mom to postpartum.  Baby to Couplet care / Skin to Skin.  CWende MottCNM 05/03/2021, 11:52 PM

## 2020-10-28 ENCOUNTER — Other Ambulatory Visit: Payer: Self-pay | Admitting: Obstetrics & Gynecology

## 2020-10-28 DIAGNOSIS — Z3682 Encounter for antenatal screening for nuchal translucency: Secondary | ICD-10-CM

## 2020-10-29 ENCOUNTER — Encounter: Payer: Self-pay | Admitting: Women's Health

## 2020-10-29 ENCOUNTER — Other Ambulatory Visit (HOSPITAL_COMMUNITY)
Admission: RE | Admit: 2020-10-29 | Discharge: 2020-10-29 | Disposition: A | Discharge status: Home / Self Care | Payer: 59 | Source: Ambulatory Visit | Attending: Obstetrics & Gynecology | Admitting: Obstetrics & Gynecology

## 2020-10-29 ENCOUNTER — Ambulatory Visit: Payer: 59 | Admitting: *Deleted

## 2020-10-29 ENCOUNTER — Ambulatory Visit (INDEPENDENT_AMBULATORY_CARE_PROVIDER_SITE_OTHER): Payer: 59

## 2020-10-29 ENCOUNTER — Ambulatory Visit (INDEPENDENT_AMBULATORY_CARE_PROVIDER_SITE_OTHER): Payer: 59 | Admitting: Women's Health

## 2020-10-29 ENCOUNTER — Other Ambulatory Visit: Payer: Self-pay

## 2020-10-29 VITALS — BP 121/72 | HR 95 | Wt 186.0 lb

## 2020-10-29 DIAGNOSIS — O0993 Supervision of high risk pregnancy, unspecified, third trimester: Secondary | ICD-10-CM | POA: Insufficient documentation

## 2020-10-29 DIAGNOSIS — Z3682 Encounter for antenatal screening for nuchal translucency: Secondary | ICD-10-CM

## 2020-10-29 DIAGNOSIS — Z3480 Encounter for supervision of other normal pregnancy, unspecified trimester: Secondary | ICD-10-CM | POA: Insufficient documentation

## 2020-10-29 DIAGNOSIS — Z3481 Encounter for supervision of other normal pregnancy, first trimester: Secondary | ICD-10-CM

## 2020-10-29 DIAGNOSIS — Z98891 History of uterine scar from previous surgery: Secondary | ICD-10-CM

## 2020-10-29 DIAGNOSIS — Z3490 Encounter for supervision of normal pregnancy, unspecified, unspecified trimester: Secondary | ICD-10-CM | POA: Insufficient documentation

## 2020-10-29 DIAGNOSIS — F419 Anxiety disorder, unspecified: Secondary | ICD-10-CM

## 2020-10-29 DIAGNOSIS — Z3A12 12 weeks gestation of pregnancy: Secondary | ICD-10-CM | POA: Diagnosis present

## 2020-10-29 LAB — POCT URINALYSIS DIPSTICK OB
Blood, UA: NEGATIVE
Glucose, UA: NEGATIVE
Ketones, UA: NEGATIVE
Leukocytes, UA: NEGATIVE
Nitrite, UA: NEGATIVE
POC,PROTEIN,UA: NEGATIVE

## 2020-10-29 MED ORDER — BONJESTA 20-20 MG PO TBCR
1.0000 | EXTENDED_RELEASE_TABLET | Freq: Every day | ORAL | 8 refills | Status: DC
Start: 2020-10-29 — End: 2021-01-16

## 2020-10-29 NOTE — Progress Notes (Signed)
INITIAL OBSTETRICAL VISIT Patient name: Paula Houston MRN [id number]  Date of birth: 1996-05-18 Chief Complaint:   Initial Prenatal Visit (Nt/it)  History of Present Illness:   Paula Houston is a 25 y.o. G20P2002 Caucasian female at [redacted]w[redacted]d by LMP c/w u/s at 6 weeks with an Estimated Date of Delivery: 05/07/21 being seen today for her initial obstetrical visit.   Her obstetrical history is significant for vag delivery, then PCS for breech w/ failed w/ ECV.   Today she reports nausea, requests meds. Has zofran- not working. Anxiety- no meds, sometimes feels like she can't catch breath d/t anxiety.  Depression screen [facility] 2/9 10/29/2020 09/18/2019 09/05/2017 06/08/2016  Decreased Interest 0 0 0 3  Down, Depressed, Hopeless 0 0 0 0  PHQ - 2 Score 0 0 0 3  Altered sleeping 1 - - 0  Tired, decreased energy 1 - - 1  Change in appetite 1 - - 0  Feeling bad or failure about yourself  0 - - 0  Trouble concentrating 0 - - 0  Moving slowly or fidgety/restless 0 - - 0  Suicidal thoughts 0 - - 0  PHQ-9 Score 3 - - 4    Patient's last menstrual period was 07/31/2020. Last pap 09/05/17. Results were: normal Review of Systems:   Pertinent items are noted in HPI Denies cramping/contractions, leakage of fluid, vaginal bleeding, abnormal vaginal discharge w/ itching/odor/irritation, headaches, visual changes, shortness of breath, chest pain, abdominal pain, severe nausea/vomiting, or problems with urination or bowel movements unless otherwise stated above.  Pertinent History Reviewed:  Reviewed past medical,surgical, social, obstetrical and family history.  Reviewed problem list, medications and allergies. OB History  Gravida Para Term Preterm AB Living  3 2 2  0 0 2  SAB IAB Ectopic Multiple Live Births  0 0 0 0 2    # Outcome Date GA Lbr Len/2nd Weight Sex Delivery Anes PTL Lv  3 Current           2 Term 12/25/16 [redacted]w[redacted]d  7 lb 8.8 oz (3.425 kg) F CS-LTranv Spinal  LIV     Complications:  Breech presentation  1 Term 12/29/10 [redacted]w[redacted]d  5 lb 14 oz (2.665 kg) M Vag-Spont EPI N LIV     Birth Comments: System Generated. Please review and update pregnancy details.   Physical Assessment:   Vitals:   10/29/20 0930  BP: 121/72  Pulse: 95  Weight: 186 lb (84.4 kg)  Body mass index is 30.02 kg/m.       Physical Examination:  General appearance - well appearing, and in no distress  Mental status - alert, oriented to person, place, and time  Psych:  She has a normal mood and affect  Skin - warm and dry, normal color, no suspicious lesions noted  Chest - effort normal, all lung fields clear to auscultation bilaterally  Heart - normal rate and regular rhythm  Abdomen - soft, nontender  Extremities:  No swelling or varicosities noted  Pelvic - VULVA: normal appearing vulva with no masses, tenderness or lesions  VAGINA: normal appearing vagina with normal color and discharge, no lesions  CERVIX: normal appearing cervix without discharge or lesions, no CMT  Thin prep pap is done w/ HR HPV cotesting  Chaperone: Celene Squibb    TODAY'S NT [place] 12+6 wks,measurements c/w dates,crl 59.87 mm,normal ovaries,NB present,NT 1.3 mm,fhr 159 bpm,anterior placenta   Results for orders placed or performed in visit on 10/29/20 (from the past 24 hour(s))  POC  Urinalysis Dipstick OB   Collection Time: 10/29/20 10:01 AM  Result Value Ref Range   Color, UA     Clarity, UA     Glucose, UA Negative Negative   Bilirubin, UA     Ketones, UA neg    Spec Grav, UA     Blood, UA neg    pH, UA     POC,PROTEIN,UA Negative Negative, Trace, Small (1+), Moderate (2+), Large (3+), 4+   Urobilinogen, UA     Nitrite, UA neg    Leukocytes, UA Negative Negative   Appearance     Odor      Assessment & Plan:  1) Low-Risk Pregnancy G3P2002 at [redacted]w[redacted]d with an Estimated Date of Delivery: 05/07/21   2) Initial OB visit  3) Nausea> rx Bonjesta  4) Anxiety> no meds, [facility] referral  5) Prev c/s> gave TOLAC consent  to review  Meds:  Meds ordered this encounter  Medications  . Doxylamine-Pyridoxine ER (BONJESTA) 20-20 MG TBCR    Sig: Take 1 tablet by mouth at bedtime. Can add 1 tablet in the morning if needed for nausea and vomiting    Dispense:  60 tablet    Refill:  8    Order Specific Question:   Supervising Provider    Answer:   Tania Ade H [[id number]]    Initial labs obtained Continue prenatal vitamins Reviewed n/v relief measures and warning s/s to report Reviewed recommended weight gain based on pre-gravid BMI Encouraged well-balanced diet Genetic & carrier screening discussed: requests Panorama and NT/IT, declines Horizon 14  Ultrasound discussed; fetal survey: requested CCNC completed> form faxed if has or is planning to apply for medicaid The nature of [place] for [employer] with multiple MDs and other Advanced Practice Providers was explained to patient; also emphasized that fellows, residents, and students are part of our team. Does not have home bp cuff. Cuff given from office. Check bp weekly, let us know if >140/90.   Follow-up: Return in about 3 weeks (around 11/19/2020) for LROB, CNM, MyChart Video.   Orders Placed This Encounter  Procedures  . Urine Culture  . Integrated 1  . Genetic Screening  . Pain Management Screening Profile (10S)  . CBC/D/Plt+RPR+Rh+ABO+Rub Ab...  . Amb ref to [employer]  . POC Urinalysis Dipstick OB    Roma Schanz CNM, [facility] 10/29/2020 10:53 AM

## 2020-10-29 NOTE — Patient Instructions (Signed)
Paula Houston, I greatly value your feedback.  If you receive a survey following your visit with [place] today, we appreciate you taking the time to fill it out.  Thanks, Paula Houston, CNM, WHNP-BC   [facility] ([place], [place] [address]) Entrance C, located off of [address] parking   Nausea & Vomiting  Have saltine crackers or pretzels by your bed and eat a few bites before you raise your head out of bed in the morning  Eat small frequent meals throughout the day instead of large meals  Drink plenty of fluids throughout the day to stay hydrated, just don't drink a lot of fluids with your meals.  This can make your stomach fill up faster making you feel sick  Do not brush your teeth right after you eat  Products with real ginger are good for nausea, like ginger ale and ginger hard candy Make sure it says made with real ginger!  Sucking on sour candy like lemon heads is also good for nausea  If your prenatal vitamins make you nauseated, take them at night so you will sleep through the nausea  Sea Bands  If you feel like you need medicine for the nausea & vomiting please let us know  If you are unable to keep any fluids or food down please let us know   Constipation  Drink plenty of fluid, preferably water, throughout the day  Eat foods high in fiber such as fruits, vegetables, and grains  Exercise, such as walking, is a good way to keep your bowels regular  Drink warm fluids, especially warm prune juice, or decaf coffee  Eat a 1/2 cup of real oatmeal (not instant), 1/2 cup applesauce, and 1/2-1 cup warm prune juice every day  If needed, you may take Colace (docusate sodium) stool softener once or twice a day to help keep the stool soft.   If you still are having problems with constipation, you may take Miralax once daily as needed to help keep your bowels regular.   Home Blood Pressure Monitoring for Patients    Your provider has recommended that you check your blood pressure (BP) at least once a week at home. If you do not have a blood pressure cuff at home, one will be provided for you. Contact your provider if you have not received your monitor within 1 week.   Helpful Tips for Accurate Home Blood Pressure Checks  . Don't smoke, exercise, or drink caffeine 30 minutes before checking your BP . Use the restroom before checking your BP (a full bladder can raise your pressure) . Relax in a comfortable upright chair . Feet on the ground . Left arm resting comfortably on a flat surface at the level of your heart . Legs uncrossed . Back supported . Sit quietly and don't talk . Place the cuff on your bare arm . Adjust snuggly, so that only two fingertips can fit between your skin and the top of the cuff . Check 2 readings separated by at least one minute . Keep a log of your BP readings . For a visual, please reference this diagram: http://ccnc.care/bpdiagram  Provider Name: [facility]     Phone: [phone]  Zone 1: ALL CLEAR  Continue to monitor your symptoms:  . BP reading is less than 140 (top number) or less than 90 (bottom number)  . No right upper stomach pain . No headaches or  seeing spots . No feeling nauseated or throwing up . No swelling in face and hands  Zone 2: CAUTION Call your doctor's office for any of the following:  . BP reading is greater than 140 (top number) or greater than 90 (bottom number)  . Stomach pain under your ribs in the middle or right side . Headaches or seeing spots . Feeling nauseated or throwing up . Swelling in face and hands  Zone 3: EMERGENCY  Seek immediate medical care if you have any of the following:  . BP reading is greater than160 (top number) or greater than 110 (bottom number) . Severe headaches not improving with Tylenol . Serious difficulty catching your breath . Any worsening symptoms from Zone 2    First Trimester of  Pregnancy The first trimester of pregnancy is from week 1 until the end of week 12 (months 1 through 3). A week after a sperm fertilizes an egg, the egg will implant on the wall of the uterus. This embryo will begin to develop into a baby. Genes from you and your partner are forming the baby. The female genes determine whether the baby is a boy or a girl. At 6-8 weeks, the eyes and face are formed, and the heartbeat can be seen on ultrasound. At the end of 12 weeks, all the baby's organs are formed.  Now that you are pregnant, you will want to do everything you can to have a healthy baby. Two of the most important things are to get good prenatal care and to follow your health care provider's instructions. Prenatal care is all the medical care you receive before the baby's birth. This care will help prevent, find, and treat any problems during the pregnancy and childbirth. BODY CHANGES Your body goes through many changes during pregnancy. The changes vary from woman to woman.   You may gain or lose a couple of pounds at first.  You may feel sick to your stomach (nauseous) and throw up (vomit). If the vomiting is uncontrollable, call your health care provider.  You may tire easily.  You may develop headaches that can be relieved by medicines approved by your health care provider.  You may urinate more often. Painful urination may mean you have a bladder infection.  You may develop heartburn as a result of your pregnancy.  You may develop constipation because certain hormones are causing the muscles that push waste through your intestines to slow down.  You may develop hemorrhoids or swollen, bulging veins (varicose veins).  Your breasts may begin to grow larger and become tender. Your nipples may stick out more, and the tissue that surrounds them (areola) may become darker.  Your gums may bleed and may be sensitive to brushing and flossing.  Dark spots or blotches (chloasma, mask of pregnancy)  may develop on your face. This will likely fade after the baby is born.  Your menstrual periods will stop.  You may have a loss of appetite.  You may develop cravings for certain kinds of food.  You may have changes in your emotions from day to day, such as being excited to be pregnant or being concerned that something may go wrong with the pregnancy and baby.  You may have more vivid and strange dreams.  You may have changes in your hair. These can include thickening of your hair, rapid growth, and changes in texture. Some women also have hair loss during or after pregnancy, or hair that feels dry or thin. Your  hair will most likely return to normal after your baby is born. WHAT TO EXPECT AT YOUR PRENATAL VISITS During a routine prenatal visit:  You will be weighed to make sure you and the baby are growing normally.  Your blood pressure will be taken.  Your abdomen will be measured to track your baby's growth.  The fetal heartbeat will be listened to starting around week 10 or 12 of your pregnancy.  Test results from any previous visits will be discussed. Your health care provider may ask you:  How you are feeling.  If you are feeling the baby move.  If you have had any abnormal symptoms, such as leaking fluid, bleeding, severe headaches, or abdominal cramping.  If you have any questions. Other tests that may be performed during your first trimester include:  Blood tests to find your blood type and to check for the presence of any previous infections. They will also be used to check for low iron levels (anemia) and Rh antibodies. Later in the pregnancy, blood tests for diabetes will be done along with other tests if problems develop.  Urine tests to check for infections, diabetes, or protein in the urine.  An ultrasound to confirm the proper growth and development of the baby.  An amniocentesis to check for possible genetic problems.  Fetal screens for spina bifida and  Down syndrome.  You may need other tests to make sure you and the baby are doing well. HOME CARE INSTRUCTIONS  Medicines  Follow your health care provider's instructions regarding medicine use. Specific medicines may be either safe or unsafe to take during pregnancy.  Take your prenatal vitamins as directed.  If you develop constipation, try taking a stool softener if your health care provider approves. Diet  Eat regular, well-balanced meals. Choose a variety of foods, such as meat or vegetable-based protein, fish, milk and low-fat dairy products, vegetables, fruits, and whole grain breads and cereals. Your health care provider will help you determine the amount of weight gain that is right for you.  Avoid raw meat and uncooked cheese. These carry germs that can cause birth defects in the baby.  Eating four or five small meals rather than three large meals a day may help relieve nausea and vomiting. If you start to feel nauseous, eating a few soda crackers can be helpful. Drinking liquids between meals instead of during meals also seems to help nausea and vomiting.  If you develop constipation, eat more high-fiber foods, such as fresh vegetables or fruit and whole grains. Drink enough fluids to keep your urine clear or pale yellow. Activity and Exercise  Exercise only as directed by your health care provider. Exercising will help you:  Control your weight.  Stay in shape.  Be prepared for labor and delivery.  Experiencing pain or cramping in the lower abdomen or low back is a good sign that you should stop exercising. Check with your health care provider before continuing normal exercises.  Try to avoid standing for long periods of time. Move your legs often if you must stand in one place for a long time.  Avoid heavy lifting.  Wear low-heeled shoes, and practice good posture.  You may continue to have sex unless your health care provider directs you otherwise. Relief of Pain  or Discomfort  Wear a good support bra for breast tenderness.    Take warm sitz baths to soothe any pain or discomfort caused by hemorrhoids. Use hemorrhoid cream if your health care provider  approves.    Rest with your legs elevated if you have leg cramps or low back pain.  If you develop varicose veins in your legs, wear support hose. Elevate your feet for 15 minutes, 3-4 times a day. Limit salt in your diet. Prenatal Care  Schedule your prenatal visits by the twelfth week of pregnancy. They are usually scheduled monthly at first, then more often in the last 2 months before delivery.  Write down your questions. Take them to your prenatal visits.  Keep all your prenatal visits as directed by your health care provider. Safety  Wear your seat belt at all times when driving.  Make a list of emergency phone numbers, including numbers for family, friends, the hospital, and police and fire departments. General Tips  Ask your health care provider for a referral to a local prenatal education class. Begin classes no later than at the beginning of month 6 of your pregnancy.  Ask for help if you have counseling or nutritional needs during pregnancy. Your health care provider can offer advice or refer you to specialists for help with various needs.  Do not use hot tubs, steam rooms, or saunas.  Do not douche or use tampons or scented sanitary pads.  Do not cross your legs for long periods of time.  Avoid cat litter boxes and soil used by cats. These carry germs that can cause birth defects in the baby and possibly loss of the fetus by miscarriage or stillbirth.  Avoid all smoking, herbs, alcohol, and medicines not prescribed by your health care provider. Chemicals in these affect the formation and growth of the baby.  Schedule a dentist appointment. At home, brush your teeth with a soft toothbrush and be gentle when you floss. SEEK MEDICAL CARE IF:   You have dizziness.  You have mild  pelvic cramps, pelvic pressure, or nagging pain in the abdominal area.  You have persistent nausea, vomiting, or diarrhea.  You have a bad smelling vaginal discharge.  You have pain with urination.  You notice increased swelling in your face, hands, legs, or ankles. SEEK IMMEDIATE MEDICAL CARE IF:   You have a fever.  You are leaking fluid from your vagina.  You have spotting or bleeding from your vagina.  You have severe abdominal cramping or pain.  You have rapid weight gain or loss.  You vomit blood or material that looks like coffee grounds.  You are exposed to [place] measles and have never had them.  You are exposed to fifth disease or chickenpox.  You develop a severe headache.  You have shortness of breath.  You have any kind of trauma, such as from a fall or a car accident. Document Released: 09/21/2001 Document Revised: 02/11/2014 Document Reviewed: 08/07/2013 [facility] Patient Information 2015 [place], [place]. This information is not intended to replace advice given to you by your health care provider. Make sure you discuss any questions you have with your health care provider.

## 2020-10-29 NOTE — Progress Notes (Signed)
[place] 12+6 wks,measurements c/w dates,crl 59.87 mm,normal ovaries,NB present,NT 1.3 mm,fhr 159 bpm,anterior placenta

## 2020-10-29 NOTE — Progress Notes (Signed)
   NURSE VISIT- [facility]  SUBJECTIVE:  Paula Houston is a 25 y.o. G33P2002 female here for Panorama NIPT . She is [redacted]w[redacted]d pregnant.   OBJECTIVE:  Appears well, in no apparent distress  Blood work drawn from right [facility] without difficulty. 1 attempt(s).   ASSESSMENT: Pregnancy [redacted]w[redacted]d Panorama NIPT  PLAN: [facility] portal information given and instructed patient how to access results   Alice Rieger  10/29/2020 9:48 AM

## 2020-10-30 LAB — PMP SCREEN PROFILE (10S), URINE
Amphetamine Scrn, Ur: NEGATIVE ng/mL
BARBITURATE SCREEN URINE: NEGATIVE ng/mL
BENZODIAZEPINE SCREEN, URINE: NEGATIVE ng/mL
CANNABINOIDS UR QL SCN: NEGATIVE ng/mL
Cocaine (Metab) Scrn, Ur: NEGATIVE ng/mL
Creatinine(Crt), U: 193.7 mg/dL (ref 20.0–300.0)
Methadone Screen, Urine: NEGATIVE ng/mL
OXYCODONE+OXYMORPHONE UR QL SCN: NEGATIVE ng/mL
Opiate Scrn, Ur: NEGATIVE ng/mL
Ph of Urine: 6.6 (ref 4.5–8.9)
Phencyclidine Qn, Ur: NEGATIVE ng/mL
Propoxyphene Scrn, Ur: NEGATIVE ng/mL

## 2020-10-31 LAB — CBC/D/PLT+RPR+RH+ABO+RUB AB...
Antibody Screen: NEGATIVE
Basophils Absolute: 0.1 10*3/uL (ref 0.0–0.2)
Basos: 0 %
EOS (ABSOLUTE): 0.2 10*3/uL (ref 0.0–0.4)
Eos: 1 %
HCV Ab: <0.1 s/co ratio (ref 0.0–0.9)
HIV Screen 4th Generation wRfx: NONREACTIVE
Hematocrit: 40.9 % (ref 34.0–46.6)
Hemoglobin: 14 g/dL (ref 11.1–15.9)
Hepatitis B Surface Ag: NEGATIVE
Immature Grans (Abs): 0.1 10*3/uL (ref 0.0–0.1)
Immature Granulocytes: 1 %
Lymphocytes Absolute: 2.8 10*3/uL (ref 0.7–3.1)
Lymphs: 21 %
MCH: 30.4 pg (ref 26.6–33.0)
MCHC: 34.2 g/dL (ref 31.5–35.7)
MCV: 89 fL (ref 79–97)
Monocytes Absolute: 0.9 10*3/uL (ref 0.1–0.9)
Monocytes: 6 %
Neutrophils Absolute: 9.8 10*3/uL — ABNORMAL HIGH (ref 1.4–7.0)
Neutrophils: 71 %
Platelets: 540 10*3/uL — ABNORMAL HIGH (ref 150–450)
RBC: 4.61 x10E6/uL (ref 3.77–5.28)
RDW: 12.5 % (ref 11.7–15.4)
RPR Ser Ql: NONREACTIVE
Rh Factor: POSITIVE
Rubella Antibodies, IGG: 2.19 index (ref >0.99)
WBC: 13.8 10*3/uL — ABNORMAL HIGH (ref 3.4–10.8)

## 2020-10-31 LAB — INTEGRATED 1
Crown Rump Length: 59.9 mm
Gest. Age on Collection Date: 12.3 weeks
Maternal Age at EDD: 25.5 yr
Nuchal Translucency (NT): 1.3 mm
Number of Fetuses: 1
PAPP-A Value: 230.7 ng/mL
Weight: 186 [lb_av]

## 2020-10-31 LAB — CYTOLOGY - PAP
Chlamydia: NEGATIVE
Comment: NEGATIVE
Comment: NORMAL
Diagnosis: NEGATIVE
Diagnosis: REACTIVE
Neisseria Gonorrhea: NEGATIVE

## 2020-10-31 LAB — URINE CULTURE

## 2020-10-31 LAB — HCV INTERPRETATION

## 2020-11-10 NOTE — BH Specialist Note (Signed)
Pt did not arrive to video visit and did not answer the phone ; Left HIPPA-compliant message to call back Kathye Cipriani from [facility] at [phone] (main office) or [phone] (Isobel Eisenhuth's office).  ; left MyChart message for patient.      

## 2020-11-19 ENCOUNTER — Encounter: Payer: Self-pay | Admitting: Obstetrics and Gynecology

## 2020-11-19 ENCOUNTER — Other Ambulatory Visit: Payer: Self-pay

## 2020-11-19 ENCOUNTER — Telehealth (INDEPENDENT_AMBULATORY_CARE_PROVIDER_SITE_OTHER): Payer: 59 | Admitting: Obstetrics and Gynecology

## 2020-11-19 VITALS — BP 119/68 | HR 82

## 2020-11-19 DIAGNOSIS — Z3A15 15 weeks gestation of pregnancy: Secondary | ICD-10-CM

## 2020-11-19 DIAGNOSIS — Z98891 History of uterine scar from previous surgery: Secondary | ICD-10-CM

## 2020-11-19 DIAGNOSIS — O34219 Maternal care for unspecified type scar from previous cesarean delivery: Secondary | ICD-10-CM

## 2020-11-19 DIAGNOSIS — Z3480 Encounter for supervision of other normal pregnancy, unspecified trimester: Secondary | ICD-10-CM

## 2020-11-19 DIAGNOSIS — E282 Polycystic ovarian syndrome: Secondary | ICD-10-CM

## 2020-11-19 DIAGNOSIS — O3482 Maternal care for other abnormalities of pelvic organs, second trimester: Secondary | ICD-10-CM

## 2020-11-19 NOTE — Progress Notes (Signed)
OBSTETRICS PRENATAL VIRTUAL VISIT ENCOUNTER NOTE  Provider location: [facility] [place] [facility]   I connected with Paula Houston on 11/19/20 at 11:10 AM EST by MyChart Video Encounter at home and verified that I am speaking with the correct person using two identifiers.   I discussed the limitations, risks, security and privacy concerns of performing an evaluation and management service virtually and the availability of in person appointments. I also discussed with the patient that there may be a patient responsible charge related to this service. The patient expressed understanding and agreed to proceed. Subjective:  Paula Houston is a 25 y.o. G3P2002 at [redacted]w[redacted]d being seen today for ongoing prenatal care.  She is currently monitored for the following issues for this low-risk pregnancy and has Painful menstrual periods; PCOS (polycystic ovarian syndrome); History of cesarean section; and Encounter for supervision of normal pregnancy, antepartum on their problem list.  Patient reports no complaints.  Contractions: Not present. Vag. Bleeding: None.  Movement: Present. Denies any leaking of fluid.   The following portions of the patient's history were reviewed and updated as appropriate: allergies, current medications, past family history, past medical history, past social history, past surgical history and problem list.   Objective:   Vitals:   11/19/20 1114  BP: 119/68  Pulse: 82    Fetal Status:     Movement: Present     General:  Alert, oriented and cooperative. Patient is in no acute distress.  Respiratory: Normal respiratory effort, no problems with respiration noted  Mental Status: Normal mood and affect. Normal behavior. Normal judgment and thought content.  Rest of physical exam deferred due to type of encounter  Imaging: US Fetal Nuchal Translucency Measurement  Result Date: 10/30/2020  [facility] [address]  [address] [place] [address]                                                                                                          NUCHAL TRANSLUCENCY FOR INTEGRATED TESTING Paula Houston is in the office for nuchal translucency sonogram as part of an integrated screen. She is a 25 y.o. year old G25P2002 with Estimated Date of Delivery: 05/07/21 by LMP now at  [redacted]w[redacted]d weeks gestation. Thus far the pregnancy has been uncomplicated. GESTATION: SINGLETON FETAL ACTIVITY:          Heart rate         159          The fetus is active. AMNIOTIC FLUID: The amniotic fluid volume is  normal visually PLACENTA LOCALIZATION:  anterior GRADE 0 CERVIX: Measures appears closed ADNEXA: The ovaries are normal. GESTATIONAL AGE AND  BIOMETRICS: Gestational criteria: Estimated Date of Delivery: 05/07/21 by LMP now at [redacted]w[redacted]d Previous Scans:1                      CROWN RUMP LENGTH           59.87 mm         12+3 weeks  NUCHAL TRANSLUCENCY           1.3 mm         normal                                                                   AVERAGE EGA(BY THIS SCAN):  12+3 weeks The fetal nasal bone is identified.  TECHNICIAN COMMENTS: [place] 12+6 wks,measurements c/w dates,crl 59.87 mm,normal ovaries,NB present,NT 1.3 mm,fhr 159 bpm,anterior placenta The patient will have the first blood draw of her integrated screening today and the second draw in approximately 4 weeks. Paula Houston 10/29/2020 9:08 AM Clinical Impression and recommendations: I have reviewed the sonogram results above, combined with the patient's current clinical course, below are my impressions and any appropriate recommendations for management based on the sonographic findings. 1.  [id number]  Estimated Date of Delivery: Estimated Date of Delivery: 05/07/21 by  LMP, early ultrasound and confirmed by today's sonographic dating 2.  Normal fetal sonographic findings, specifically normal nuchal translucency and present fetal nasal bone Additionally the cranium, both choroid plexuses,  zygomatic Houston, mandible, 4 equal extremities, stomach, bladder and anterior abdomen are all noted. 3.  Normal general sonographic findings Recommend routine prenatal care based on this sonogram or as clinically indicated Florian Buff 10/30/2020 7:53 AM                                                   Assessment and Plan:  Pregnancy: G3P2002 at [redacted]w[redacted]d 1. Supervision of other normal pregnancy, antepartum Stable Anatomy scan next visit  2. History of cesarean section Discuss TOLAC vs Repeat c section at future visits  Preterm labor symptoms and general obstetric precautions including but not limited to vaginal bleeding, contractions, leaking of fluid and fetal movement were reviewed in detail with the patient. I discussed the assessment and treatment plan with the patient. The patient was provided an opportunity to ask questions and all were answered. The patient agreed with the plan and demonstrated an understanding of the instructions. The patient was advised to call back or seek an in-person office evaluation/go to [facility] at [facility] for any urgent or concerning symptoms. Please refer to After Visit Summary for other counseling recommendations.   I provided 8 minutes of face-to-face time during this encounter.  Return in about 4 weeks (around 12/17/2020) for OB visit, face to face, any provider.  Future Appointments  Date Time Provider [place]  11/24/2020  1:15 PM [place], [place] [facility], [facility]

## 2020-11-24 ENCOUNTER — Ambulatory Visit: Payer: 59 | Admitting: Clinical

## 2020-11-24 DIAGNOSIS — Z91199 Patient's noncompliance with other medical treatment and regimen due to unspecified reason: Secondary | ICD-10-CM

## 2020-11-24 DIAGNOSIS — Z5329 Procedure and treatment not carried out because of patient's decision for other reasons: Secondary | ICD-10-CM

## 2020-12-17 ENCOUNTER — Other Ambulatory Visit: Payer: Self-pay | Admitting: Obstetrics and Gynecology

## 2020-12-17 DIAGNOSIS — Z363 Encounter for antenatal screening for malformations: Secondary | ICD-10-CM

## 2020-12-19 ENCOUNTER — Ambulatory Visit: Payer: 59 | Admitting: Obstetrics & Gynecology

## 2020-12-19 ENCOUNTER — Encounter: Payer: Self-pay | Admitting: Obstetrics & Gynecology

## 2020-12-19 ENCOUNTER — Other Ambulatory Visit: Payer: Self-pay

## 2020-12-19 ENCOUNTER — Ambulatory Visit (INDEPENDENT_AMBULATORY_CARE_PROVIDER_SITE_OTHER): Payer: 59

## 2020-12-19 VITALS — BP 122/75 | HR 95 | Wt 189.0 lb

## 2020-12-19 DIAGNOSIS — Z1379 Encounter for other screening for genetic and chromosomal anomalies: Secondary | ICD-10-CM

## 2020-12-19 DIAGNOSIS — Z3480 Encounter for supervision of other normal pregnancy, unspecified trimester: Secondary | ICD-10-CM

## 2020-12-19 DIAGNOSIS — Z363 Encounter for antenatal screening for malformations: Secondary | ICD-10-CM | POA: Diagnosis not present

## 2020-12-19 DIAGNOSIS — O34219 Maternal care for unspecified type scar from previous cesarean delivery: Secondary | ICD-10-CM

## 2020-12-19 MED ORDER — PRENATAL PLUS 27-1 MG PO TABS: 1.0000 | ORAL_TABLET | Freq: Every day | ORAL | 3 refills | Status: AC

## 2020-12-19 NOTE — Progress Notes (Signed)
[place] 20+1 wks,breech,anterior placenta gr 0,normal ovaries,fhr 152 bpm,cx 3.6 cm,svp of fluid 5.4 cm,EFW 330 g 40%,anatomy complete,no obvious abnormalities

## 2020-12-19 NOTE — Progress Notes (Signed)
   LOW-RISK PREGNANCY VISIT Patient name: Paula Houston MRN [id number]  Date of birth: 04/04/1996 Chief Complaint:   Routine Prenatal Visit ([place] today; 2nd IT today)  History of Present Illness:   Paula Houston is a 25 y.o. G57P2002 female at [redacted]w[redacted]d with an Estimated Date of Delivery: 05/07/21 being seen today for ongoing management of a low-risk pregnancy.   Depression screen [facility] 2/9 10/29/2020 09/18/2019 09/05/2017 06/08/2016  Decreased Interest 0 0 0 3  Down, Depressed, Hopeless 0 0 0 0  PHQ - 2 Score 0 0 0 3  Altered sleeping 1 - - 0  Tired, decreased energy 1 - - 1  Change in appetite 1 - - 0  Feeling bad or failure about yourself  0 - - 0  Trouble concentrating 0 - - 0  Moving slowly or fidgety/restless 0 - - 0  Suicidal thoughts 0 - - 0  PHQ-9 Score 3 - - 4    Today she reports mild nausea- no meds. Contractions: Not present. Vag. Bleeding: None.  Movement: Present. denies leaking of fluid. Review of Systems:   Pertinent items are noted in HPI Denies abnormal vaginal discharge w/ itching/odor/irritation, headaches, visual changes, shortness of breath, chest pain, abdominal pain, severe nausea/vomiting, or problems with urination or bowel movements unless otherwise stated above. Pertinent History Reviewed:  Reviewed past medical,surgical, social, obstetrical and family history.  Reviewed problem list, medications and allergies.  Physical Assessment:   Vitals:   12/19/20 1011  BP: 122/75  Pulse: 95  Weight: 189 lb (85.7 kg)  Body mass index is 30.51 kg/m.        Physical Examination:   General appearance: Well appearing, and in no distress  Mental status: Alert, oriented to person, place, and time  Skin: Warm & dry  Respiratory: Normal respiratory effort, no distress  Abdomen: Soft, gravid, nontender  Pelvic: Cervical exam deferred         Extremities: Edema: Trace  Psych:  mood and affect appropriate  Fetal Status: Fetal Heart Rate (bpm): 143 [place]   Movement:  Present    Chaperone: n/a    No results found for this or any previous visit (from the past 24 hour(s)).   Assessment & Plan:  1) Low-risk pregnancy G3P2002 at [redacted]w[redacted]d with an Estimated Date of Delivery: 05/07/21   2) h/o C-section x 1 (breech presentation) Discussed pros/cons of TOLAC.  Reviewed that based on reason for C-section and prior vaginal delivery, she would be a great candidate.  Pt wishes to review her options discussed with her that we would probably talk about this again in the 3rd trimester   Meds: No orders of the defined types were placed in this encounter.  Labs/procedures today: Anatomy scan- normal, female, IT2  Plan:  Continue routine obstetrical care Next visit: prefers in person    Reviewed: Preterm labor symptoms and general obstetric precautions including but not limited to vaginal bleeding, contractions, leaking of fluid and fetal movement were reviewed in detail with the patient.  All questions were answered. Pt has home bp cuff. Check bp weekly, let us know if >140/90.   Follow-up: Return in about 4 weeks (around 01/16/2021) for [place] visit.  Orders Placed This Encounter  Procedures  . INTEGRATED 2    Janyth Pupa, DO Attending [place], [facility] for [employer], [place]

## 2020-12-24 LAB — INTEGRATED 2
AFP MoM: 0.81
Alpha-Fetoprotein: 35.1 ng/mL
Crown Rump Length: 59.9 mm
DIA MoM: 1.01
DIA Value: 154.7 pg/mL
Estriol, Unconjugated: 3.28 ng/mL
Gest. Age on Collection Date: 12.3 weeks
Gestational Age: 19.6 weeks
Maternal Age at EDD: 25.5 yr
Nuchal Translucency (NT): 1.3 mm
Nuchal Translucency MoM: 0.97
Number of Fetuses: 1
PAPP-A MoM: 0.32
PAPP-A Value: 230.7 ng/mL
Test Results:: NEGATIVE
Weight: 186 [lb_av]
Weight: 189 [lb_av]
hCG MoM: 0.52
hCG Value: 10.3 IU/mL
uE3 MoM: 1.72

## 2021-01-16 ENCOUNTER — Ambulatory Visit (INDEPENDENT_AMBULATORY_CARE_PROVIDER_SITE_OTHER): Payer: 59 | Admitting: Advanced Practice Midwife

## 2021-01-16 ENCOUNTER — Other Ambulatory Visit: Payer: Self-pay

## 2021-01-16 ENCOUNTER — Encounter: Payer: Self-pay | Admitting: Advanced Practice Midwife

## 2021-01-16 VITALS — BP 118/76 | HR 103 | Wt 195.0 lb

## 2021-01-16 DIAGNOSIS — Z3480 Encounter for supervision of other normal pregnancy, unspecified trimester: Secondary | ICD-10-CM

## 2021-01-16 DIAGNOSIS — Z3A24 24 weeks gestation of pregnancy: Secondary | ICD-10-CM

## 2021-01-16 NOTE — Progress Notes (Signed)
   LOW-RISK PREGNANCY VISIT Patient name: Paula Houston MRN [id number]  Date of birth: 06-Jul-1996 Chief Complaint:   Routine Prenatal Visit  History of Present Illness:   Paula Houston is a 25 y.o. G30P2002 female at [redacted]w[redacted]d with an Estimated Date of Delivery: 05/07/21 being seen today for ongoing management of a low-risk pregnancy.  Today she reports doing well; decision for TOLAC. Contractions: Not present. Vag. Bleeding: None.  Movement: Present. denies leaking of fluid. Review of Systems:   Pertinent items are noted in HPI Denies abnormal vaginal discharge w/ itching/odor/irritation, headaches, visual changes, shortness of breath, chest pain, abdominal pain, severe nausea/vomiting, or problems with urination or bowel movements unless otherwise stated above. Pertinent History Reviewed:  Reviewed past medical,surgical, social, obstetrical and family history.  Reviewed problem list, medications and allergies. Physical Assessment:   Vitals:   01/16/21 0951  BP: 118/76  Pulse: (!) 103  Weight: 195 lb (88.5 kg)  Body mass index is 31.47 kg/m.        Physical Examination:   General appearance: Well appearing, and in no distress  Mental status: Alert, oriented to person, place, and time  Skin: Warm & dry  Cardiovascular: Normal heart rate noted  Respiratory: Normal respiratory effort, no distress  Abdomen: Soft, gravid, nontender  Pelvic: Cervical exam deferred         Extremities: Edema: None  Fetal Status: Fetal Heart Rate (bpm): 152 Fundal Height: 24 cm Movement: Present    No results found for this or any previous visit (from the past 24 hour(s)).  Assessment & Plan:  1) Low-risk pregnancy G3P2002 at [redacted]w[redacted]d with an Estimated Date of Delivery: 05/07/21   2) Prev C/S (breech) with prior vag del, decision for TOLAC; will meet with MD ~ 36wks to sign consent   Meds: No orders of the defined types were placed in this encounter.  Labs/procedures today: none  Plan:   Continue routine obstetrical care   Reviewed: Preterm labor symptoms and general obstetric precautions including but not limited to vaginal bleeding, contractions, leaking of fluid and fetal movement were reviewed in detail with the patient.  All questions were answered. Has home bp cuff. Check bp weekly, let us know if >140/90.   Follow-up: Return in about 3 weeks (around 02/06/2021) for LROB, PN2.  No orders of the defined types were placed in this encounter.  Myrtis Ser CNM 01/16/2021 10:51 AM

## 2021-01-16 NOTE — Patient Instructions (Signed)
Paula Houston, I greatly value your feedback.  If you receive a survey following your visit with [place] today, we appreciate you taking the time to fill it out.  Thanks, Derrill Memo, CNM   You will have your sugar test next visit.  Please do not eat or drink anything after midnight the night before you come, not even water.  You will be here for at least two hours.  Please make an appointment online for the bloodwork at [email or web address] for 8:30am (or as close to this as possible). Make sure you select the [facility] service center. The day of the appointment, check in with our office first, then you will go to [place] to start the sugar test.    [place]!!! It is now [place] at [facility] ([place], [place] [address]) Entrance C, located off of [address] parking  Go to [email or web address] to register for FREE online childbirth classes   Call the office [phone]) or go to [facility] if:  You begin to have strong, frequent contractions  Your water breaks.  Sometimes it is a big gush of fluid, sometimes it is just a trickle that keeps getting your panties wet or running down your legs  You have vaginal bleeding.  It is normal to have a small amount of spotting if your cervix was checked.   You don't feel your baby moving like normal.  If you don't, get you something to eat and drink and lay down and focus on feeling your baby move.   If your baby is still not moving like normal, you should call the office or go to [place] Pediatricians/Family Doctors:  [place] [phone]                 [place] [phone] (usually not accepting new patients unless you have family there already, you are always welcome to call and ask)       [facility] Department [phone]       [facility] Pediatricians/Family Doctors:    [facility]: [phone]  [facility]: [phone]  [facility]: [place] Doctors:   [facility]: [facility]: [place]:  [place]: [phone]   Home Blood Pressure Monitoring for Patients   Your provider has recommended that you check your blood pressure (BP) at least once a week at home. If you do not have a blood pressure cuff at home, one will be provided for you. Contact your provider if you have not received your monitor within 1 week.   Helpful Tips for Accurate Home Blood Pressure Checks  . Don't smoke, exercise, or drink caffeine 30 minutes before checking your BP . Use the restroom before checking your BP (a full bladder can raise your pressure) . Relax in a comfortable upright chair . Feet on the ground . Left arm resting comfortably on a flat surface at the level of your heart . Legs uncrossed . Back supported . Sit quietly and don't talk . Place the cuff on your bare arm . Adjust snuggly, so that only two fingertips can fit between your skin and the top of the cuff . Check 2 readings separated by at least one minute . Keep a log of your BP  readings . For a visual, please reference this diagram: http://ccnc.care/bpdiagram  Provider Name: [facility]     Phone: [phone]  Zone 1: ALL CLEAR  Continue to monitor your symptoms:  . BP reading is less than 140 (top number) or less than 90 (bottom number)  . No right upper stomach pain . No headaches or seeing spots . No feeling nauseated or throwing up . No swelling in face and hands  Zone 2: CAUTION Call your doctor's office for any of the following:  . BP reading is greater than 140 (top number) or greater than 90 (bottom number)  . Stomach pain under your ribs in the middle or right side . Headaches or seeing spots . Feeling  nauseated or throwing up . Swelling in face and hands  Zone 3: EMERGENCY  Seek immediate medical care if you have any of the following:  . BP reading is greater than160 (top number) or greater than 110 (bottom number) . Severe headaches not improving with Tylenol . Serious difficulty catching your breath . Any worsening symptoms from Zone 2   Second Trimester of Pregnancy The second trimester is from week 13 through week 28, months 4 through 6. The second trimester is often a time when you feel your best. Your body has also adjusted to being pregnant, and you begin to feel better physically. Usually, morning sickness has lessened or quit completely, you may have more energy, and you may have an increase in appetite. The second trimester is also a time when the fetus is growing rapidly. At the end of the sixth month, the fetus is about 9 inches long and weighs about 1 pounds. You will likely begin to feel the baby move (quickening) between 18 and 20 weeks of the pregnancy. BODY CHANGES Your body goes through many changes during pregnancy. The changes vary from woman to woman.   Your weight will continue to increase. You will notice your lower abdomen bulging out.  You may begin to get stretch marks on your hips, abdomen, and breasts.  You may develop headaches that can be relieved by medicines approved by your health care provider.  You may urinate more often because the fetus is pressing on your bladder.  You may develop or continue to have heartburn as a result of your pregnancy.  You may develop constipation because certain hormones are causing the muscles that push waste through your intestines to slow down.  You may develop hemorrhoids or swollen, bulging veins (varicose veins).  You may have back pain because of the weight gain and pregnancy hormones relaxing your joints between the bones in your pelvis and as a result of a shift in weight and the muscles that support your  balance.  Your breasts will continue to grow and be tender.  Your gums may bleed and may be sensitive to brushing and flossing.  Dark spots or blotches (chloasma, mask of pregnancy) may develop on your face. This will likely fade after the baby is born.  A dark line from your belly button to the pubic area (linea nigra) may appear. This will likely fade after the baby is born.  You may have changes in your hair. These can include thickening of your hair, rapid growth, and changes in texture. Some women also have hair loss during or after pregnancy, or hair that feels dry or thin. Your hair will most likely return to normal after your baby is born. WHAT TO EXPECT AT YOUR PRENATAL VISITS During  a routine prenatal visit:  You will be weighed to make sure you and the fetus are growing normally.  Your blood pressure will be taken.  Your abdomen will be measured to track your baby's growth.  The fetal heartbeat will be listened to.  Any test results from the previous visit will be discussed. Your health care provider may ask you:  How you are feeling.  If you are feeling the baby move.  If you have had any abnormal symptoms, such as leaking fluid, bleeding, severe headaches, or abdominal cramping.  If you have any questions. Other tests that may be performed during your second trimester include:  Blood tests that check for:  Low iron levels (anemia).  Gestational diabetes (between 24 and 28 weeks).  Rh antibodies.  Urine tests to check for infections, diabetes, or protein in the urine.  An ultrasound to confirm the proper growth and development of the baby.  An amniocentesis to check for possible genetic problems.  Fetal screens for spina bifida and Down syndrome. HOME CARE INSTRUCTIONS   Avoid all smoking, herbs, alcohol, and unprescribed drugs. These chemicals affect the formation and growth of the baby.  Follow your health care provider's instructions regarding  medicine use. There are medicines that are either safe or unsafe to take during pregnancy.  Exercise only as directed by your health care provider. Experiencing uterine cramps is a good sign to stop exercising.  Continue to eat regular, healthy meals.  Wear a good support bra for breast tenderness.  Do not use hot tubs, steam rooms, or saunas.  Wear your seat belt at all times when driving.  Avoid raw meat, uncooked cheese, cat litter boxes, and soil used by cats. These carry germs that can cause birth defects in the baby.  Take your prenatal vitamins.  Try taking a stool softener (if your health care provider approves) if you develop constipation. Eat more high-fiber foods, such as fresh vegetables or fruit and whole grains. Drink plenty of fluids to keep your urine clear or pale yellow.  Take warm sitz baths to soothe any pain or discomfort caused by hemorrhoids. Use hemorrhoid cream if your health care provider approves.  If you develop varicose veins, wear support hose. Elevate your feet for 15 minutes, 3-4 times a day. Limit salt in your diet.  Avoid heavy lifting, wear low heel shoes, and practice good posture.  Rest with your legs elevated if you have leg cramps or low back pain.  Visit your dentist if you have not gone yet during your pregnancy. Use a soft toothbrush to brush your teeth and be gentle when you floss.  A sexual relationship may be continued unless your health care provider directs you otherwise.  Continue to go to all your prenatal visits as directed by your health care provider. SEEK MEDICAL CARE IF:   You have dizziness.  You have mild pelvic cramps, pelvic pressure, or nagging pain in the abdominal area.  You have persistent nausea, vomiting, or diarrhea.  You have a bad smelling vaginal discharge.  You have pain with urination. SEEK IMMEDIATE MEDICAL CARE IF:   You have a fever.  You are leaking fluid from your vagina.  You have spotting or  bleeding from your vagina.  You have severe abdominal cramping or pain.  You have rapid weight gain or loss.  You have shortness of breath with chest pain.  You notice sudden or extreme swelling of your face, hands, ankles, feet, or legs.  You  have not felt your baby move in over an hour.  You have severe headaches that do not go away with medicine.  You have vision changes. Document Released: 09/21/2001 Document Revised: 10/02/2013 Document Reviewed: 11/28/2012 [facility] Patient Information 2015 [place], [place]. This information is not intended to replace advice given to you by your health care provider. Make sure you discuss any questions you have with your health care provider.

## 2021-02-11 ENCOUNTER — Ambulatory Visit (INDEPENDENT_AMBULATORY_CARE_PROVIDER_SITE_OTHER): Payer: 59 | Admitting: Advanced Practice Midwife

## 2021-02-11 ENCOUNTER — Other Ambulatory Visit: Payer: Self-pay

## 2021-02-11 ENCOUNTER — Other Ambulatory Visit: Payer: 59

## 2021-02-11 VITALS — BP 123/76 | HR 114 | Wt 198.0 lb

## 2021-02-11 DIAGNOSIS — Z3A27 27 weeks gestation of pregnancy: Secondary | ICD-10-CM

## 2021-02-11 DIAGNOSIS — Z131 Encounter for screening for diabetes mellitus: Secondary | ICD-10-CM

## 2021-02-11 DIAGNOSIS — F419 Anxiety disorder, unspecified: Secondary | ICD-10-CM

## 2021-02-11 DIAGNOSIS — Z3480 Encounter for supervision of other normal pregnancy, unspecified trimester: Secondary | ICD-10-CM

## 2021-02-11 NOTE — Progress Notes (Signed)
   LOW-RISK PREGNANCY VISIT Patient name: Paula Houston MRN [id number]  Date of birth: 28-Dec-1995 Chief Complaint:   Routine Prenatal Visit  History of Present Illness:   Paula Houston is a 25 y.o. G8P2002 female at [redacted]w[redacted]d with an Estimated Date of Delivery: 05/07/21 being seen today for ongoing management of a low-risk pregnancy.  Today she reports feeling 'blah' from the glucola- needed to repeat the test a few times in the past due to vomiting. Contractions: Not present. Vag. Bleeding: None.  Movement: Present. denies leaking of fluid. Review of Systems:   Pertinent items are noted in HPI Denies abnormal vaginal discharge w/ itching/odor/irritation, headaches, visual changes, shortness of breath, chest pain, abdominal pain, severe nausea/vomiting, or problems with urination or bowel movements unless otherwise stated above. Pertinent History Reviewed:  Reviewed past medical,surgical, social, obstetrical and family history.  Reviewed problem list, medications and allergies. Physical Assessment:   Vitals:   02/11/21 0937  BP: 123/76  Pulse: (!) 114  Weight: 198 lb (89.8 kg)  Body mass index is 31.96 kg/m.        Physical Examination:   General appearance: Well appearing, and in no distress  Mental status: Alert, oriented to person, place, and time  Skin: Warm & dry  Cardiovascular: Normal heart rate noted  Respiratory: Normal respiratory effort, no distress  Abdomen: Soft, gravid, nontender  Pelvic: Cervical exam deferred         Extremities: Edema: None  Fetal Status: Fetal Heart Rate (bpm): 135 Fundal Height: 28 cm Movement: Present    No results found for this or any previous visit (from the past 24 hour(s)).  Assessment & Plan:  1) Low-risk pregnancy G3P2002 at [redacted]w[redacted]d with an Estimated Date of Delivery: 05/07/21   2) Anxiety, doing well without meds for the most part, has occasional 'flares' but is overall well  3) Previous c/s, desires TOLAC, will see MD later in  3rd tri for consent   Meds: No orders of the defined types were placed in this encounter.  Labs/procedures today: PN2 (may need to sip a little water to get thru it)  Plan:  Continue routine obstetrical care   Reviewed: Preterm labor symptoms and general obstetric precautions including but not limited to vaginal bleeding, contractions, leaking of fluid and fetal movement were reviewed in detail with the patient.  All questions were answered. Has home bp cuff. Check bp weekly, let us know if >140/90.   Follow-up: Return for 2-3wks; LROB, in person.  No orders of the defined types were placed in this encounter.  Myrtis Ser CNM 02/11/2021 9:58 AM

## 2021-02-11 NOTE — Patient Instructions (Signed)
Paula Houston, I greatly value your feedback.  If you receive a survey following your visit with [place] today, we appreciate you taking the time to fill it out.  Thanks, Derrill Memo, [place] at [facility] ([place], [place] [address]) Entrance C, located off of [address] parking  Go to [email or web address] to register for FREE online childbirth classes   Call the office [phone]) or go to [facility] if:  You begin to have strong, frequent contractions  Your water breaks.  Sometimes it is a big gush of fluid, sometimes it is just a trickle that keeps getting your panties wet or running down your legs  You have vaginal bleeding.  It is normal to have a small amount of spotting if your cervix was checked.   You don't feel your baby moving like normal.  If you don't, get you something to eat and drink and lay down and focus on feeling your baby move.  You should feel at least 10 movements in 2 hours.  If you don't, you should call the office or go to [facility].    Tdap Vaccine  It is recommended that you get the Tdap vaccine during the third trimester of EACH pregnancy to help protect your baby from getting pertussis (whooping cough)  27-36 weeks is the BEST time to do this so that you can pass the protection on to your baby. During pregnancy is better than after pregnancy, but if you are unable to get it during pregnancy it will be offered at the hospital.   You can get this vaccine with [place], at the health department, your family doctor, or some local pharmacies  Everyone who will be around your baby should also be up-to-date on their vaccines before the baby comes. Adults (who are not pregnant) only need 1 dose of Tdap during adulthood.   [place] Pediatricians/Family Doctors:  [facility] [phone]                 [facility]  [phone] (usually not accepting new patients unless you have family there already, you are always welcome to call and ask)       [facility] [phone]       [facility] Pediatricians/Family Doctors:   [facility]: [phone]  [facility]: [phone]  [facility]: [place] Doctors:   [facility]: [facility]: [place]:  [place]: [phone]   Home Blood Pressure Monitoring for Patients   Your provider has recommended that you check your blood pressure (BP) at least once a week at home. If you do not have a blood pressure cuff at home, one will be provided for you. Contact your provider if you have not received your monitor within 1 week.   Helpful Tips for Accurate Home Blood Pressure Checks  . Don't smoke, exercise, or drink caffeine 30 minutes before checking your BP . Use the restroom before checking your BP (a full bladder can raise your pressure) . Relax in a comfortable upright chair . Feet on the ground . Left arm resting comfortably on a flat surface at the level of your heart . Legs uncrossed . Back supported . Sit quietly and don't talk . Place the cuff on your bare  arm . Adjust snuggly, so that only two fingertips can fit between your skin and the top of the cuff . Check 2 readings separated by at least one minute . Keep a log of your BP readings . For a visual, please reference this diagram: http://ccnc.care/bpdiagram  Provider Name: [facility]     Phone: [phone]  Zone 1: ALL CLEAR  Continue to monitor your symptoms:  . BP reading is less than 140 (top number) or less than 90 (bottom number)  . No right upper stomach pain . No headaches or seeing spots . No feeling nauseated or throwing up . No swelling in face and hands  Zone 2: CAUTION Call your  doctor's office for any of the following:  . BP reading is greater than 140 (top number) or greater than 90 (bottom number)  . Stomach pain under your ribs in the middle or right side . Headaches or seeing spots . Feeling nauseated or throwing up . Swelling in face and hands  Zone 3: EMERGENCY  Seek immediate medical care if you have any of the following:  . BP reading is greater than160 (top number) or greater than 110 (bottom number) . Severe headaches not improving with Tylenol . Serious difficulty catching your breath . Any worsening symptoms from Zone 2   Third Trimester of Pregnancy The third trimester is from week 29 through week 42, months 7 through 9. The third trimester is a time when the fetus is growing rapidly. At the end of the ninth month, the fetus is about 20 inches in length and weighs 6-10 pounds.  BODY CHANGES Your body goes through many changes during pregnancy. The changes vary from woman to woman.   Your weight will continue to increase. You can expect to gain 25-35 pounds (11-16 kg) by the end of the pregnancy.  You may begin to get stretch marks on your hips, abdomen, and breasts.  You may urinate more often because the fetus is moving lower into your pelvis and pressing on your bladder.  You may develop or continue to have heartburn as a result of your pregnancy.  You may develop constipation because certain hormones are causing the muscles that push waste through your intestines to slow down.  You may develop hemorrhoids or swollen, bulging veins (varicose veins).  You may have pelvic pain because of the weight gain and pregnancy hormones relaxing your joints between the bones in your pelvis. Backaches may result from overexertion of the muscles supporting your posture.  You may have changes in your hair. These can include thickening of your hair, rapid growth, and changes in texture. Some women also have hair loss during or after pregnancy, or hair that  feels dry or thin. Your hair will most likely return to normal after your baby is born.  Your breasts will continue to grow and be tender. A yellow discharge may leak from your breasts called colostrum.  Your belly button may stick out.  You may feel short of breath because of your expanding uterus.  You may notice the fetus "dropping," or moving lower in your abdomen.  You may have a bloody mucus discharge. This usually occurs a few days to a week before labor begins.  Your cervix becomes thin and soft (effaced) near your due date. WHAT TO EXPECT AT YOUR PRENATAL EXAMS  You will have prenatal exams every 2 weeks until week 36. Then, you will have weekly prenatal exams. During a routine prenatal visit:  You  will be weighed to make sure you and the fetus are growing normally.  Your blood pressure is taken.  Your abdomen will be measured to track your baby's growth.  The fetal heartbeat will be listened to.  Any test results from the previous visit will be discussed.  You may have a cervical check near your due date to see if you have effaced. At around 36 weeks, your caregiver will check your cervix. At the same time, your caregiver will also perform a test on the secretions of the vaginal tissue. This test is to determine if a type of bacteria, Group B streptococcus, is present. Your caregiver will explain this further. Your caregiver may ask you:  What your birth plan is.  How you are feeling.  If you are feeling the baby move.  If you have had any abnormal symptoms, such as leaking fluid, bleeding, severe headaches, or abdominal cramping.  If you have any questions. Other tests or screenings that may be performed during your third trimester include:  Blood tests that check for low iron levels (anemia).  Fetal testing to check the health, activity level, and growth of the fetus. Testing is done if you have certain medical conditions or if there are problems during the  pregnancy. FALSE LABOR You may feel small, irregular contractions that eventually go away. These are called Braxton Hicks contractions, or false labor. Contractions may last for hours, days, or even weeks before true labor sets in. If contractions come at regular intervals, intensify, or become painful, it is best to be seen by your caregiver.  SIGNS OF LABOR   Menstrual-like cramps.  Contractions that are 5 minutes apart or less.  Contractions that start on the top of the uterus and spread down to the lower abdomen and back.  A sense of increased pelvic pressure or back pain.  A watery or bloody mucus discharge that comes from the vagina. If you have any of these signs before the 37th week of pregnancy, call your caregiver right away. You need to go to the hospital to get checked immediately. HOME CARE INSTRUCTIONS   Avoid all smoking, herbs, alcohol, and unprescribed drugs. These chemicals affect the formation and growth of the baby.  Follow your caregiver's instructions regarding medicine use. There are medicines that are either safe or unsafe to take during pregnancy.  Exercise only as directed by your caregiver. Experiencing uterine cramps is a good sign to stop exercising.  Continue to eat regular, healthy meals.  Wear a good support bra for breast tenderness.  Do not use hot tubs, steam rooms, or saunas.  Wear your seat belt at all times when driving.  Avoid raw meat, uncooked cheese, cat litter boxes, and soil used by cats. These carry germs that can cause birth defects in the baby.  Take your prenatal vitamins.  Try taking a stool softener (if your caregiver approves) if you develop constipation. Eat more high-fiber foods, such as fresh vegetables or fruit and whole grains. Drink plenty of fluids to keep your urine clear or pale yellow.  Take warm sitz baths to soothe any pain or discomfort caused by hemorrhoids. Use hemorrhoid cream if your caregiver approves.  If you  develop varicose veins, wear support hose. Elevate your feet for 15 minutes, 3-4 times a day. Limit salt in your diet.  Avoid heavy lifting, wear low heal shoes, and practice good posture.  Rest a lot with your legs elevated if you have leg cramps or low back  pain.  Visit your dentist if you have not gone during your pregnancy. Use a soft toothbrush to brush your teeth and be gentle when you floss.  A sexual relationship may be continued unless your caregiver directs you otherwise.  Do not travel far distances unless it is absolutely necessary and only with the approval of your caregiver.  Take prenatal classes to understand, practice, and ask questions about the labor and delivery.  Make a trial run to the hospital.  Pack your hospital bag.  Prepare the baby's nursery.  Continue to go to all your prenatal visits as directed by your caregiver. SEEK MEDICAL CARE IF:  You are unsure if you are in labor or if your water has broken.  You have dizziness.  You have mild pelvic cramps, pelvic pressure, or nagging pain in your abdominal area.  You have persistent nausea, vomiting, or diarrhea.  You have a bad smelling vaginal discharge.  You have pain with urination. SEEK IMMEDIATE MEDICAL CARE IF:   You have a fever.  You are leaking fluid from your vagina.  You have spotting or bleeding from your vagina.  You have severe abdominal cramping or pain.  You have rapid weight loss or gain.  You have shortness of breath with chest pain.  You notice sudden or extreme swelling of your face, hands, ankles, feet, or legs.  You have not felt your baby move in over an hour.  You have severe headaches that do not go away with medicine.  You have vision changes. Document Released: 09/21/2001 Document Revised: 10/02/2013 Document Reviewed: 11/28/2012 [facility] Patient Information 2015 [place], [place]. This information is not intended to replace advice given to you by your health  care provider. Make sure you discuss any questions you have with your health care provider.

## 2021-02-12 ENCOUNTER — Encounter: Payer: Self-pay | Admitting: Advanced Practice Midwife

## 2021-02-12 ENCOUNTER — Other Ambulatory Visit: Payer: Self-pay | Admitting: *Deleted

## 2021-02-12 DIAGNOSIS — O0993 Supervision of high risk pregnancy, unspecified, third trimester: Secondary | ICD-10-CM

## 2021-02-12 DIAGNOSIS — O24410 Gestational diabetes mellitus in pregnancy, diet controlled: Secondary | ICD-10-CM

## 2021-02-12 DIAGNOSIS — O24419 Gestational diabetes mellitus in pregnancy, unspecified control: Secondary | ICD-10-CM | POA: Insufficient documentation

## 2021-02-12 LAB — CBC
Hematocrit: 36.8 % (ref 34.0–46.6)
Hemoglobin: 12.4 g/dL (ref 11.1–15.9)
MCH: 30.4 pg (ref 26.6–33.0)
MCHC: 33.7 g/dL (ref 31.5–35.7)
MCV: 90 fL (ref 79–97)
Platelets: 476 x10E3/uL — ABNORMAL HIGH (ref 150–450)
RBC: 4.08 x10E6/uL (ref 3.77–5.28)
RDW: 12.8 % (ref 11.7–15.4)
WBC: 13 x10E3/uL — ABNORMAL HIGH (ref 3.4–10.8)

## 2021-02-12 LAB — RPR: RPR Ser Ql: NONREACTIVE

## 2021-02-12 LAB — HIV ANTIBODY (ROUTINE TESTING W REFLEX): HIV Screen 4th Generation wRfx: NONREACTIVE

## 2021-02-12 LAB — ANTIBODY SCREEN: Antibody Screen: NEGATIVE

## 2021-02-12 LAB — GLUCOSE TOLERANCE, 2 HOURS W/ 1HR
Glucose, 1 hour: 201 mg/dL — ABNORMAL HIGH (ref 65–179)
Glucose, 2 hour: 154 mg/dL — ABNORMAL HIGH (ref 65–152)
Glucose, Fasting: 86 mg/dL (ref 65–91)

## 2021-02-12 MED ORDER — ACCU-CHEK SOFTCLIX LANCETS MISC: 12 refills | Status: DC

## 2021-02-12 MED ORDER — ACCU-CHEK GUIDE VI STRP
ORAL_STRIP | 12 refills | Status: DC
Start: 2021-02-12 — End: 2021-05-05

## 2021-02-12 MED ORDER — ACCU-CHEK GUIDE ME W/DEVICE KIT: 1.0000 | PACK | Freq: Four times a day (QID) | 0 refills | Status: DC

## 2021-02-18 ENCOUNTER — Encounter: Payer: 59 | Attending: Advanced Practice Midwife | Admitting: Nutrition

## 2021-02-18 ENCOUNTER — Encounter: Payer: Self-pay | Admitting: Nutrition

## 2021-02-18 ENCOUNTER — Other Ambulatory Visit: Payer: Self-pay

## 2021-02-18 VITALS — Ht 66.0 in | Wt 198.0 lb

## 2021-02-18 DIAGNOSIS — O24410 Gestational diabetes mellitus in pregnancy, diet controlled: Secondary | ICD-10-CM | POA: Insufficient documentation

## 2021-02-18 NOTE — Progress Notes (Signed)
Medical Nutrition Therapy  Appointment Start time:  0830 Appointment End time:  0930  Primary concerns today: GDM Referral diagnosis: 024.4 Preferred learning style: no preference indicated Learning readiness: change in progress  NUTRITION ASSESSMENT  EDU 05-07-21   G3, P2 Currently 28 weeks 6 days Anthropometrics  Wt Readings from Last 3 Encounters:  02/18/21 198 lb (89.8 kg)  02/11/21 198 lb (89.8 kg)  01/16/21 195 lb (88.5 kg)   Ht Readings from Last 3 Encounters:  02/18/21 5' 6" (1.676 m)  09/09/20 5' 6" (1.676 m)  12/17/19 5' 6" (1.676 m)   Body mass index is 31.96 kg/m. @BMIFA@ Facility age limit for growth percentiles is 20 years. Facility age limit for growth percentiles is 20 years.   Patient was seen on  02-18-21 for Gestational Diabetes self-management  at [facility]. The following learning objectives were met by the patient during this course:   States the definition of Gestational Diabetes  States why dietary management is important in controlling blood glucose  Describes the effects each nutrient has on blood glucose levels  Demonstrates ability to create a balanced meal plan  Demonstrates carbohydrate counting   States when to check blood glucose levels  Demonstrates proper blood glucose monitoring techniques  States the effect of stress and exercise on blood glucose levels  States the importance of limiting caffeine and abstaining from alcohol and smoking  Already has the Accucheck Guide Meter and testing supplies.  FBS 92-106 After meals less than 120 usually. One exception was 161 last night due to eating late and had a large baked potato,  Goals Follow  The Gestational Meal Plan discussed Test blood sugars before breakfast and 2 hours after each meal Do not eat fruit or drink milk with breakfast but can eat/drink them at other times during the day.  Patient instructed to monitor glucose levels: FBS: 60 -  <90 1 hour: <140 2 hour: <120   *Patient received handouts:  Nutrition Diabetes and Pregnancy  Carbohydrate Counting List  Patient will be seen for follow-up in 1 week.  

## 2021-02-18 NOTE — Patient Instructions (Signed)
Goals Follow  The Gestational Meal Plan discussed Test blood sugars before breakfast and 2 hours after each meal Do not eat fruit or drink milk with breakfast but can eat/drink them at other times during the day.  Patient instructed to monitor glucose levels: FBS: 60 - <90 1 hour: <140 2 hour: <120

## 2021-02-25 ENCOUNTER — Encounter: Payer: Self-pay | Admitting: Nutrition

## 2021-02-25 ENCOUNTER — Other Ambulatory Visit: Payer: Self-pay

## 2021-02-25 ENCOUNTER — Encounter: Payer: 59 | Attending: Family Medicine | Admitting: Nutrition

## 2021-02-25 VITALS — Ht 66.0 in | Wt 194.0 lb

## 2021-02-25 DIAGNOSIS — O24410 Gestational diabetes mellitus in pregnancy, diet controlled: Secondary | ICD-10-CM | POA: Diagnosis not present

## 2021-02-25 NOTE — Patient Instructions (Signed)
Goals Keep up the great job! Follow  The Gestational Meal Plan discussed Test blood sugars before breakfast and 2 hours after each meal Do not eat fruit or drink milk with breakfast but can eat/drink them at other times during the day.  Patient instructed to monitor glucose levels: FBS: 60 - <90 1 hour: <140 2 hour: <120

## 2021-02-25 NOTE — Progress Notes (Signed)
Medical Nutrition Therapy  Begin 1115 and End 1130  Primary concerns today: GDM Follow up Referral diagnosis: 024.4 Preferred learning style: no preference indicated Learning readiness: change in progress  NUTRITION ASSESSMENT  EDU 05-07-21   G3, P2 Currently  29 weeks 6 days  Anthropometrics  Wt Readings from Last 3 Encounters:  02/25/21 194 lb (88 kg)  02/18/21 198 lb (89.8 kg)  02/11/21 198 lb (89.8 kg)   Ht Readings from Last 3 Encounters:  02/25/21 5\' 6"  (1.676 m)  02/18/21 5\' 6"  (1.676 m)  09/09/20 5\' 6"  (1.676 m)   Body mass index is 31.31 kg/m. @BMIFA @ Facility age limit for growth percentiles is 20 years. Facility age limit for growth percentiles is 20 years.   Already has the Accucheck Guide Meter and testing supplies.  FBS 92-106 After meals less than 120 usually. One exception was 161 mg/dl last night due to eating late and had a large baked potato,  B) Bagel  Larger one with  Or avacado and toast L) Sandwich and fruit, water Dinner Meat, vegetables, fruit, water  She is doing very well. BS are WNL. She is watching what she is eating and being more mindful about carbs and balanced meals.  *Patient received handouts:  Carbohydrate Counting List  Patient will be seen for follow-up PRN

## 2021-03-04 ENCOUNTER — Ambulatory Visit (INDEPENDENT_AMBULATORY_CARE_PROVIDER_SITE_OTHER): Payer: 59 | Admitting: Advanced Practice Midwife

## 2021-03-04 ENCOUNTER — Other Ambulatory Visit: Payer: Self-pay

## 2021-03-04 VITALS — BP 116/71 | HR 117 | Wt 197.0 lb

## 2021-03-04 DIAGNOSIS — Z1389 Encounter for screening for other disorder: Secondary | ICD-10-CM

## 2021-03-04 DIAGNOSIS — Z23 Encounter for immunization: Secondary | ICD-10-CM | POA: Diagnosis not present

## 2021-03-04 DIAGNOSIS — O0993 Supervision of high risk pregnancy, unspecified, third trimester: Secondary | ICD-10-CM

## 2021-03-04 DIAGNOSIS — O24410 Gestational diabetes mellitus in pregnancy, diet controlled: Secondary | ICD-10-CM

## 2021-03-04 DIAGNOSIS — Z3A30 30 weeks gestation of pregnancy: Secondary | ICD-10-CM

## 2021-03-04 DIAGNOSIS — B379 Candidiasis, unspecified: Secondary | ICD-10-CM

## 2021-03-04 LAB — POCT URINALYSIS DIPSTICK OB
Blood, UA: NEGATIVE
Glucose, UA: NEGATIVE
Nitrite, UA: NEGATIVE

## 2021-03-04 MED ORDER — FLUCONAZOLE 150 MG PO TABS: 150.0000 mg | ORAL_TABLET | Freq: Once | ORAL | 0 refills | Status: AC

## 2021-03-04 NOTE — Patient Instructions (Signed)
Paula Houston, I greatly value your feedback.  If you receive a survey following your visit with [place] today, we appreciate you taking the time to fill it out.  Thanks, Derrill Memo CNM   [place] at [facility] ([place], [place] [address]) Entrance C, located off of [address] parking  Go to [email or web address] to register for FREE online childbirth classes   Call the office [phone]) or go to [facility] if:  You begin to have strong, frequent contractions  Your water breaks.  Sometimes it is a big gush of fluid, sometimes it is just a trickle that keeps getting your panties wet or running down your legs  You have vaginal bleeding.  It is normal to have a small amount of spotting if your cervix was checked.   You don't feel your baby moving like normal.  If you don't, get you something to eat and drink and lay down and focus on feeling your baby move.  You should feel at least 10 movements in 2 hours.  If you don't, you should call the office or go to [facility].    Tdap Vaccine  It is recommended that you get the Tdap vaccine during the third trimester of EACH pregnancy to help protect your baby from getting pertussis (whooping cough)  27-36 weeks is the BEST time to do this so that you can pass the protection on to your baby. During pregnancy is better than after pregnancy, but if you are unable to get it during pregnancy it will be offered at the hospital.   You can get this vaccine with [place], at the health department, your family doctor, or some local pharmacies  Everyone who will be around your baby should also be up-to-date on their vaccines before the baby comes. Adults (who are not pregnant) only need 1 dose of Tdap during adulthood.   [place] Pediatricians/Family Doctors:  [facility] [phone]                 [place]  [phone] (usually not accepting new patients unless you have family there already, you are always welcome to call and ask)       [facility] Department [phone]       [facility] Pediatricians/Family Doctors:   [facility]: [phone]  [facility]: [phone]  [facility]: [place] Doctors:   [facility]: [facility]: [place]:  [place]: [phone]   Home Blood Pressure Monitoring for Patients   Your provider has recommended that you check your blood pressure (BP) at least once a week at home. If you do not have a blood pressure cuff at home, one will be provided for you. Contact your provider if you have not received your monitor within 1 week.   Helpful Tips for Accurate Home Blood Pressure Checks  . Don't smoke, exercise, or drink caffeine 30 minutes before checking your BP . Use the restroom before checking your BP (a full bladder can raise your pressure) . Relax in a comfortable upright chair . Feet on the ground . Left arm resting comfortably on a flat surface at the level of your heart . Legs uncrossed . Back supported . Sit quietly and don't talk . Place the cuff on your bare  arm . Adjust snuggly, so that only two fingertips can fit between your skin and the top of the cuff . Check 2 readings separated by at least one minute . Keep a log of your BP readings . For a visual, please reference this diagram: http://ccnc.care/bpdiagram  Provider Name: [facility]     Phone: [phone]  Zone 1: ALL CLEAR  Continue to monitor your symptoms:  . BP reading is less than 140 (top number) or less than 90 (bottom number)  . No right upper stomach pain . No headaches or seeing spots . No feeling nauseated or throwing up . No swelling in face and hands  Zone 2: CAUTION Call your  doctor's office for any of the following:  . BP reading is greater than 140 (top number) or greater than 90 (bottom number)  . Stomach pain under your ribs in the middle or right side . Headaches or seeing spots . Feeling nauseated or throwing up . Swelling in face and hands  Zone 3: EMERGENCY  Seek immediate medical care if you have any of the following:  . BP reading is greater than160 (top number) or greater than 110 (bottom number) . Severe headaches not improving with Tylenol . Serious difficulty catching your breath . Any worsening symptoms from Zone 2   Third Trimester of Pregnancy The third trimester is from week 29 through week 42, months 7 through 9. The third trimester is a time when the fetus is growing rapidly. At the end of the ninth month, the fetus is about 20 inches in length and weighs 6-10 pounds.  BODY CHANGES Your body goes through many changes during pregnancy. The changes vary from woman to woman.   Your weight will continue to increase. You can expect to gain 25-35 pounds (11-16 kg) by the end of the pregnancy.  You may begin to get stretch marks on your hips, abdomen, and breasts.  You may urinate more often because the fetus is moving lower into your pelvis and pressing on your bladder.  You may develop or continue to have heartburn as a result of your pregnancy.  You may develop constipation because certain hormones are causing the muscles that push waste through your intestines to slow down.  You may develop hemorrhoids or swollen, bulging veins (varicose veins).  You may have pelvic pain because of the weight gain and pregnancy hormones relaxing your joints between the bones in your pelvis. Backaches may result from overexertion of the muscles supporting your posture.  You may have changes in your hair. These can include thickening of your hair, rapid growth, and changes in texture. Some women also have hair loss during or after pregnancy, or hair that  feels dry or thin. Your hair will most likely return to normal after your baby is born.  Your breasts will continue to grow and be tender. A yellow discharge may leak from your breasts called colostrum.  Your belly button may stick out.  You may feel short of breath because of your expanding uterus.  You may notice the fetus "dropping," or moving lower in your abdomen.  You may have a bloody mucus discharge. This usually occurs a few days to a week before labor begins.  Your cervix becomes thin and soft (effaced) near your due date. WHAT TO EXPECT AT YOUR PRENATAL EXAMS  You will have prenatal exams every 2 weeks until week 36. Then, you will have weekly prenatal exams. During a routine prenatal visit:  You  will be weighed to make sure you and the fetus are growing normally.  Your blood pressure is taken.  Your abdomen will be measured to track your baby's growth.  The fetal heartbeat will be listened to.  Any test results from the previous visit will be discussed.  You may have a cervical check near your due date to see if you have effaced. At around 36 weeks, your caregiver will check your cervix. At the same time, your caregiver will also perform a test on the secretions of the vaginal tissue. This test is to determine if a type of bacteria, Group B streptococcus, is present. Your caregiver will explain this further. Your caregiver may ask you:  What your birth plan is.  How you are feeling.  If you are feeling the baby move.  If you have had any abnormal symptoms, such as leaking fluid, bleeding, severe headaches, or abdominal cramping.  If you have any questions. Other tests or screenings that may be performed during your third trimester include:  Blood tests that check for low iron levels (anemia).  Fetal testing to check the health, activity level, and growth of the fetus. Testing is done if you have certain medical conditions or if there are problems during the  pregnancy. FALSE LABOR You may feel small, irregular contractions that eventually go away. These are called Braxton Hicks contractions, or false labor. Contractions may last for hours, days, or even weeks before true labor sets in. If contractions come at regular intervals, intensify, or become painful, it is best to be seen by your caregiver.  SIGNS OF LABOR   Menstrual-like cramps.  Contractions that are 5 minutes apart or less.  Contractions that start on the top of the uterus and spread down to the lower abdomen and back.  A sense of increased pelvic pressure or back pain.  A watery or bloody mucus discharge that comes from the vagina. If you have any of these signs before the 37th week of pregnancy, call your caregiver right away. You need to go to the hospital to get checked immediately. HOME CARE INSTRUCTIONS   Avoid all smoking, herbs, alcohol, and unprescribed drugs. These chemicals affect the formation and growth of the baby.  Follow your caregiver's instructions regarding medicine use. There are medicines that are either safe or unsafe to take during pregnancy.  Exercise only as directed by your caregiver. Experiencing uterine cramps is a good sign to stop exercising.  Continue to eat regular, healthy meals.  Wear a good support bra for breast tenderness.  Do not use hot tubs, steam rooms, or saunas.  Wear your seat belt at all times when driving.  Avoid raw meat, uncooked cheese, cat litter boxes, and soil used by cats. These carry germs that can cause birth defects in the baby.  Take your prenatal vitamins.  Try taking a stool softener (if your caregiver approves) if you develop constipation. Eat more high-fiber foods, such as fresh vegetables or fruit and whole grains. Drink plenty of fluids to keep your urine clear or pale yellow.  Take warm sitz baths to soothe any pain or discomfort caused by hemorrhoids. Use hemorrhoid cream if your caregiver approves.  If you  develop varicose veins, wear support hose. Elevate your feet for 15 minutes, 3-4 times a day. Limit salt in your diet.  Avoid heavy lifting, wear low heal shoes, and practice good posture.  Rest a lot with your legs elevated if you have leg cramps or low back  pain.  Visit your dentist if you have not gone during your pregnancy. Use a soft toothbrush to brush your teeth and be gentle when you floss.  A sexual relationship may be continued unless your caregiver directs you otherwise.  Do not travel far distances unless it is absolutely necessary and only with the approval of your caregiver.  Take prenatal classes to understand, practice, and ask questions about the labor and delivery.  Make a trial run to the hospital.  Pack your hospital bag.  Prepare the baby's nursery.  Continue to go to all your prenatal visits as directed by your caregiver. SEEK MEDICAL CARE IF:  You are unsure if you are in labor or if your water has broken.  You have dizziness.  You have mild pelvic cramps, pelvic pressure, or nagging pain in your abdominal area.  You have persistent nausea, vomiting, or diarrhea.  You have a bad smelling vaginal discharge.  You have pain with urination. SEEK IMMEDIATE MEDICAL CARE IF:   You have a fever.  You are leaking fluid from your vagina.  You have spotting or bleeding from your vagina.  You have severe abdominal cramping or pain.  You have rapid weight loss or gain.  You have shortness of breath with chest pain.  You notice sudden or extreme swelling of your face, hands, ankles, feet, or legs.  You have not felt your baby move in over an hour.  You have severe headaches that do not go away with medicine.  You have vision changes. Document Released: 09/21/2001 Document Revised: 10/02/2013 Document Reviewed: 11/28/2012 [facility] Patient Information 2015 [place], [place]. This information is not intended to replace advice given to you by your health  care provider. Make sure you discuss any questions you have with your health care provider.

## 2021-03-04 NOTE — Progress Notes (Signed)
HIGH-RISK PREGNANCY VISIT Patient name: Paula Houston MRN [id number]  Date of birth: 08/07/1996 Chief Complaint:   Routine Prenatal Visit and High Risk Gestation  History of Present Illness:   Paula Houston is a 25 y.o. G59P2002 female at [redacted]w[redacted]d with an Estimated Date of Delivery: 05/07/21 being seen today for ongoing management of a high-risk pregnancy complicated by diabetes mellitus A1DM.  Today she reports vag itching that isn't going away with Monistat.  FBS <95, 2hr PP 3 values higher than 120 since 5/12 with 4x/day values Depression screen [facility] 2/9 02/18/2021 02/11/2021 10/29/2020 09/18/2019 09/05/2017  Decreased Interest 0 0 0 0 0  Down, Depressed, Hopeless 0 0 0 0 0  PHQ - 2 Score 0 0 0 0 0  Altered sleeping - 0 1 - -  Tired, decreased energy - 2 1 - -  Change in appetite - 0 1 - -  Feeling bad or failure about yourself  - 0 0 - -  Trouble concentrating - 0 0 - -  Moving slowly or fidgety/restless - 0 0 - -  Suicidal thoughts - 0 0 - -  PHQ-9 Score - 2 3 - -    Contractions: Irritability. Vag. Bleeding: None.  Movement: Present. denies leaking of fluid.  Review of Systems:   Pertinent items are noted in HPI Denies abnormal vaginal discharge w/ itching/odor/irritation, headaches, visual changes, shortness of breath, chest pain, abdominal pain, severe nausea/vomiting, or problems with urination or bowel movements unless otherwise stated above. Pertinent History Reviewed:  Reviewed past medical,surgical, social, obstetrical and family history.  Reviewed problem list, medications and allergies. Physical Assessment:   Vitals:   03/04/21 1018  BP: 116/71  Pulse: (!) 117  Weight: 197 lb (89.4 kg)  Body mass index is 31.8 kg/m.           Physical Examination:   General appearance: alert, well appearing, and in no distress  Mental status: alert, oriented to person, place, and time  Skin: warm & dry   Extremities: Edema: None    Cardiovascular: normal heart rate  noted  Respiratory: normal respiratory effort, no distress  Abdomen: gravid, soft, non-tender  Pelvic: Cervical exam deferred         Fetal Status: Fetal Heart Rate (bpm): 147 Fundal Height: 30 cm Movement: Present    Fetal Surveillance Testing today: doppler     Results for orders placed or performed in visit on 03/04/21 (from the past 24 hour(s))  POC Urinalysis Dipstick OB   Collection Time: 03/04/21 10:29 AM  Result Value Ref Range   Color, UA     Clarity, UA     Glucose, UA Negative Negative   Bilirubin, UA     Ketones, UA trace    Spec Grav, UA     Blood, UA neg    pH, UA     POC,PROTEIN,UA Trace Negative, Trace, Small (1+), Moderate (2+), Large (3+), 4+   Urobilinogen, UA     Nitrite, UA neg    Leukocytes, UA Large (3+) (A) Negative   Appearance     Odor      Assessment & Plan:  High-risk pregnancy: [id number] at [redacted]w[redacted]d with an Estimated Date of Delivery: 05/07/21   1) VVC, Diflucan 150mg  sent in to try to and get her some relief from itching  2) GDMA1, only 3 abnl values with dietary indiscretion, encouraged walking; growth scan with next visit and then again 36-37wks  3) Prev C/S for breech, meet with MD ~  36wks for consent  Meds:  Meds ordered this encounter  Medications  . fluconazole (DIFLUCAN) 150 MG tablet    Sig: Take 1 tablet (150 mg total) by mouth once for 1 dose.    Dispense:  1 tablet    Refill:  0    Order Specific Question:   Supervising Provider    Answer:   Florian Buff [[id number]]    Labs/procedures today: tdap  Treatment Plan:  Growth scans ~32 and 36wks, IOL ~40wks  Reviewed: Preterm labor symptoms and general obstetric precautions including but not limited to vaginal bleeding, contractions, leaking of fluid and fetal movement were reviewed in detail with the patient.  All questions were answered. Does have home bp cuff. Office bp cuff given: not applicable. Check bp weekly, let us know if consistently >140 and/or >90.  Follow-up: Return in  about 2 weeks (around 03/18/2021) for HROB, [place]: EFW, in person.   No future appointments.  Orders Placed This Encounter  Procedures  . US OB Follow Up  . Tdap vaccine greater than or equal to 7yo IM  . POC Urinalysis Dipstick OB   Myrtis Ser [facility] 03/04/2021 10:54 AM

## 2021-03-20 ENCOUNTER — Ambulatory Visit (INDEPENDENT_AMBULATORY_CARE_PROVIDER_SITE_OTHER): Payer: 59 | Admitting: Obstetrics & Gynecology

## 2021-03-20 ENCOUNTER — Encounter: Payer: Self-pay | Admitting: Obstetrics & Gynecology

## 2021-03-20 ENCOUNTER — Ambulatory Visit (INDEPENDENT_AMBULATORY_CARE_PROVIDER_SITE_OTHER): Payer: 59

## 2021-03-20 ENCOUNTER — Other Ambulatory Visit: Payer: Self-pay

## 2021-03-20 VITALS — BP 107/70 | HR 95 | Wt 196.0 lb

## 2021-03-20 DIAGNOSIS — Z1389 Encounter for screening for other disorder: Secondary | ICD-10-CM

## 2021-03-20 DIAGNOSIS — O0993 Supervision of high risk pregnancy, unspecified, third trimester: Secondary | ICD-10-CM

## 2021-03-20 DIAGNOSIS — O24410 Gestational diabetes mellitus in pregnancy, diet controlled: Secondary | ICD-10-CM | POA: Diagnosis not present

## 2021-03-20 DIAGNOSIS — F419 Anxiety disorder, unspecified: Secondary | ICD-10-CM

## 2021-03-20 MED ORDER — FLUCONAZOLE 150 MG PO TABS: ORAL_TABLET | ORAL | 1 refills | Status: DC

## 2021-03-20 NOTE — Progress Notes (Signed)
[place] 31+4 wks,cephalic,anterior placenta gr 0,FHR 142 bpm,AFI 13.6 cm,EFW 2300 g 64%

## 2021-03-20 NOTE — Progress Notes (Signed)
[place] PREGNANCY VISIT Patient name: Paula Houston MRN [id number]  Date of birth: 06/18/1996 Chief Complaint:   Routine Prenatal Visit, Pregnancy Ultrasound, and High Risk Gestation  History of Present Illness:   Paula Houston is a 25 y.o. G58P2002 female at [redacted]w[redacted]d with an Estimated Date of Delivery: 05/07/21 being seen today for ongoing management of a high-risk pregnancy complicated by diabetes mellitus A1DM.    Today she reports fatigue. Contractions: Not present. Vag. Bleeding: None.  Movement: Present. reports no leaking of fluid.   Depression screen [facility] 2/9 02/18/2021 02/11/2021 10/29/2020 09/18/2019 09/05/2017  Decreased Interest 0 0 0 0 0  Down, Depressed, Hopeless 0 0 0 0 0  PHQ - 2 Score 0 0 0 0 0  Altered sleeping - 0 1 - -  Tired, decreased energy - 2 1 - -  Change in appetite - 0 1 - -  Feeling bad or failure about yourself  - 0 0 - -  Trouble concentrating - 0 0 - -  Moving slowly or fidgety/restless - 0 0 - -  Suicidal thoughts - 0 0 - -  PHQ-9 Score - 2 3 - -     GAD 7 : Generalized Anxiety Score 02/11/2021 10/29/2020  Nervous, Anxious, on Edge 0 1  Control/stop worrying 0 0  Worry too much - different things 0 0  Trouble relaxing 0 0  Restless 0 0  Easily annoyed or irritable 0 1  Afraid - awful might happen 0 0  Total GAD 7 Score 0 2     Review of Systems:   Pertinent items are noted in HPI Denies abnormal vaginal discharge w/ itching/odor/irritation, headaches, visual changes, shortness of breath, chest pain, abdominal pain, severe nausea/vomiting, or problems with urination or bowel movements unless otherwise stated above. Pertinent History Reviewed:  Reviewed past medical,surgical, social, obstetrical and family history.  Reviewed problem list, medications and allergies. Physical Assessment:   Vitals:   03/20/21 1227  BP: 107/70  Pulse: 95  Weight: 196 lb (88.9 kg)  Body mass index is 31.64 kg/m.           Physical Examination:   General  appearance: alert, well appearing, and in no distress  Mental status: alert, oriented to person, place, and time  Skin: warm & dry   Extremities: Edema: Trace    Cardiovascular: normal heart rate noted  Respiratory: normal respiratory effort, no distress  Abdomen: gravid, soft, non-tender  Pelvic: Cervical exam deferred         Fetal Status:     Movement: Present    Fetal Surveillance Testing today: sonogram   Chaperone: N/A    No results found for this or any previous visit (from the past 24 hour(s)).  Assessment & Plan:  High-risk pregnancy: G3P2002 at [redacted]w[redacted]d with an Estimated Date of Delivery: 05/07/21   1) A1DM, stable, some fastings are borderline  2) Previous C section, stable  Meds: No orders of the defined types were placed in this encounter.   Labs/procedures today: U/S  Treatment Plan:  2 weeks folow up  Reviewed: Preterm labor symptoms and general obstetric precautions including but not limited to vaginal bleeding, contractions, leaking of fluid and fetal movement were reviewed in detail with the patient.  All questions were answered. Does have home bp cuff. Office bp cuff given: not applicable. Check bp weekly, let us know if consistently >140 and/or >90.  Follow-up: No follow-ups on file.   No future appointments.  Orders Placed This  Encounter  Procedures   POC Urinalysis Dipstick OB   Florian Buff  03/20/2021 12:36 PM

## 2021-04-03 ENCOUNTER — Ambulatory Visit (INDEPENDENT_AMBULATORY_CARE_PROVIDER_SITE_OTHER): Payer: 59 | Admitting: Obstetrics & Gynecology

## 2021-04-03 ENCOUNTER — Encounter: Payer: Self-pay | Admitting: Obstetrics & Gynecology

## 2021-04-03 ENCOUNTER — Other Ambulatory Visit: Payer: Self-pay

## 2021-04-03 VITALS — BP 113/70 | HR 85 | Wt 196.4 lb

## 2021-04-03 DIAGNOSIS — O0993 Supervision of high risk pregnancy, unspecified, third trimester: Secondary | ICD-10-CM

## 2021-04-03 DIAGNOSIS — Z1389 Encounter for screening for other disorder: Secondary | ICD-10-CM

## 2021-04-03 DIAGNOSIS — O24410 Gestational diabetes mellitus in pregnancy, diet controlled: Secondary | ICD-10-CM

## 2021-04-03 LAB — POCT URINALYSIS DIPSTICK OB
Blood, UA: NEGATIVE
Glucose, UA: NEGATIVE
Ketones, UA: NEGATIVE
Nitrite, UA: NEGATIVE
POC,PROTEIN,UA: NEGATIVE

## 2021-04-03 NOTE — Progress Notes (Signed)
HIGH-RISK PREGNANCY VISIT Patient name: CHALESE PEACH MRN [id number]  Date of birth: 25-Jun-1996 Chief Complaint:   Routine Prenatal Visit and High Risk Gestation  History of Present Illness:   ANNEKE CUNDY is a 25 y.o. G7P2002 female at 19w1dwith an Estimated Date of Delivery: 05/07/21 being seen today for ongoing management of a high-risk pregnancy complicated by diabetes mellitus A1DM.    Today she reports no complaints.  She does note some white discharge- no itching or irritation.  She states it has been present for awhile- declined exam/treatment.  Previously exam completed.  Contractions: Irritability. Vag. Bleeding: None.  Movement: Present. denies leaking of fluid.   [facility] 02/18/2021 02/11/2021 10/29/2020 09/18/2019 09/05/2017  Decreased Interest 0 0 0 0 0  Down, Depressed, Hopeless 0 0 0 0 0  PHQ - 2 Score 0 0 0 0 0  Altered sleeping - 0 1 - -  Tired, decreased energy - 2 1 - -  Change in appetite - 0 1 - -  Feeling bad or failure about yourself  - 0 0 - -  Trouble concentrating - 0 0 - -  Moving slowly or fidgety/restless - 0 0 - -  Suicidal thoughts - 0 0 - -  PHQ-9 Score - 2 3 - -     Current Outpatient Medications  Medication Instructions   Accu-Chek Softclix Lancets lancets Use as instructed to check blood sugar 4 times daily   Acetaminophen (TYLENOL) 325 MG CAPS Oral, As needed   Blood Glucose Monitoring Suppl (ACCU-CHEK GUIDE ME) w/Device KIT 1 each, Does not apply, 4 times daily   glucose blood (ACCU-CHEK GUIDE) test strip Use as instructed to check blood sugar 4 times daily     Review of Systems:   Pertinent items are noted in HPI Denies headaches, visual changes, shortness of breath, chest pain, abdominal pain, severe nausea/vomiting, or problems with urination or bowel movements unless otherwise stated above. Pertinent History Reviewed:  Reviewed past medical,surgical, social, obstetrical and family history.  Reviewed problem  list, medications and allergies. Physical Assessment:   Vitals:   04/03/21 1129  BP: 113/70  Pulse: 85  Weight: 196 lb 6.4 oz (89.1 kg)  Body mass index is 31.7 kg/m.           Physical Examination:   General appearance: alert, well appearing, and in no distress  Mental status: alert, oriented to person, place, and time  Skin: warm & dry   Extremities: Edema: None    Cardiovascular: normal heart rate noted  Respiratory: normal respiratory effort, no distress  Abdomen: gravid, soft, non-tender  Pelvic: Cervical exam deferred         Fetal Status: Fetal Heart Rate (bpm): 145 Fundal Height: 34 cm Movement: Present    Fetal Surveillance Testing today: doppler   Chaperone: N/A    Results for orders placed or performed in visit on 04/03/21 (from the past 24 hour(s))  POC Urinalysis Dipstick OB   Collection Time: 04/03/21 11:35 AM  Result Value Ref Range   Color, UA     Clarity, UA     Glucose, UA Negative Negative   Bilirubin, UA     Ketones, UA neg    Spec Grav, UA     Blood, UA neg    pH, UA     POC,PROTEIN,UA Negative Negative, Trace, Small (1+), Moderate (2+), Large (3+), 4+   Urobilinogen, UA     Nitrite, UA neg    Leukocytes,  UA Large (3+) (A) Negative   Appearance     Odor       Assessment & Plan:  High-risk pregnancy: G3P2002 at 90w1dwith an Estimated Date of Delivery: 05/07/21   1) GDMA1 -sugars reviewed- well controlled with diet -continue growth q 4wks, last completed 6/10  2) TOLAC- consent completed with Dr. EElonda Husky []  GBS next visit  Meds: No orders of the defined types were placed in this encounter.   Labs/procedures today: none  Treatment Plan:  continue with OB care as outlined above  Reviewed: Preterm labor symptoms and general obstetric precautions including but not limited to vaginal bleeding, contractions, leaking of fluid and fetal movement were reviewed in detail with the patient.  All questions were answered. Pt has home bp cuff. Check  bp weekly, let u[place]now if >140/90.   Follow-up: Return in about 1 week (around 04/10/2021) for [place], in 2 wks growth scan with appt.   Future Appointments  Date Time Provider [place] 04/10/2021 12:50 PM MJanet Berlin MD [facility]  04/20/2021  3:00 PM [facility] [place] None  04/20/2021  3:50 PM BRoma Schanz CNM [facility]    Orders Placed This Encounter  Procedures   POC Urinalysis Dipstick OB    JJanyth Pupa DO Attending OBur[place] FBaptist Memorial Rehabilitation Hospitalfor WDe[employer] Foods Company CA[place]

## 2021-04-09 ENCOUNTER — Encounter: Payer: Self-pay | Admitting: *Deleted

## 2021-04-10 ENCOUNTER — Other Ambulatory Visit (HOSPITAL_COMMUNITY)
Admission: RE | Admit: 2021-04-10 | Discharge: 2021-04-10 | Disposition: A | Discharge status: Home / Self Care | Payer: 59 | Source: Ambulatory Visit | Attending: Obstetrics and Gynecology | Admitting: Obstetrics and Gynecology

## 2021-04-10 ENCOUNTER — Ambulatory Visit (INDEPENDENT_AMBULATORY_CARE_PROVIDER_SITE_OTHER): Payer: 59 | Admitting: Obstetrics and Gynecology

## 2021-04-10 ENCOUNTER — Other Ambulatory Visit: Payer: Self-pay

## 2021-04-10 VITALS — BP 114/69 | HR 87 | Wt 198.0 lb

## 2021-04-10 DIAGNOSIS — Z3A36 36 weeks gestation of pregnancy: Secondary | ICD-10-CM | POA: Diagnosis not present

## 2021-04-10 DIAGNOSIS — O24410 Gestational diabetes mellitus in pregnancy, diet controlled: Secondary | ICD-10-CM

## 2021-04-10 DIAGNOSIS — Z1389 Encounter for screening for other disorder: Secondary | ICD-10-CM | POA: Diagnosis not present

## 2021-04-10 DIAGNOSIS — O0993 Supervision of high risk pregnancy, unspecified, third trimester: Secondary | ICD-10-CM | POA: Insufficient documentation

## 2021-04-10 DIAGNOSIS — Z98891 History of uterine scar from previous surgery: Secondary | ICD-10-CM

## 2021-04-10 LAB — POCT URINALYSIS DIPSTICK OB
Blood, UA: NEGATIVE
Glucose, UA: NEGATIVE
Ketones, UA: NEGATIVE
Nitrite, UA: NEGATIVE

## 2021-04-10 NOTE — Progress Notes (Signed)
   PRENATAL VISIT NOTE  Subjective:  Paula Houston is a 25 y.o. G3P2002 at [redacted]w[redacted]d being seen today for ongoing prenatal care.  She is currently monitored for the following issues for this  high risk  pregnancy and has Painful menstrual periods; PCOS (polycystic ovarian syndrome); History of cesarean section; Encounter for supervision of high risk pregnancy in third trimester, antepartum; Anxiety; and Gestational diabetes on their problem list.  Patient reports no complaints.  Contractions: Irritability. Vag. Bleeding: None.  Movement: Present. Denies leaking of fluid.   CBGs reviewed. All appropriate. No concerns today.   The following portions of the patient's history were reviewed and updated as appropriate: allergies, current medications, past family history, past medical history, past social history, past surgical history and problem list.   Objective:   Vitals:   04/10/21 1131  BP: 114/69  Pulse: 87  Weight: 198 lb (89.8 kg)    Fetal Status: Fetal Heart Rate (bpm): 145 Fundal Height: 36 cm Movement: Present     General:  Alert, oriented and cooperative. Patient is in no acute distress.  Skin: Skin is warm and dry. No rash noted.   Cardiovascular: Normal heart rate noted  Respiratory: Normal respiratory effort, no problems with respiration noted  Abdomen: Soft, gravid, appropriate for gestational age.  Pain/Pressure: Absent     Pelvic: Cervical exam performed in the presence of a chaperone Dilation: 1 Effacement (%): Thick Station: Ballotable  Extremities: Normal range of motion.  Edema: Trace  Mental Status: Normal mood and affect. Normal behavior. Normal judgment and thought content.   Assessment and Plan:  Pregnancy: G3P2002 at [redacted]w[redacted]d 1. High-risk pregnancy in third trimester Doing well overall   2. Screening for genitourinary condition - POC Urinalysis Dipstick OB  3. [redacted] weeks gestation of pregnancy Swabs done today  - Cervicovaginal ancillary only( [facility]) - Culture, beta strep (group b only)  4. Diet controlled gestational diabetes mellitus (GDM) in third trimester CBG appropriate, continue current care. Has OB US/Appointment 7/11.   5. History of cesarean section TOLAC consent signed.   Preterm labor symptoms and general obstetric precautions including but not limited to vaginal bleeding, contractions, leaking of fluid and fetal movement were reviewed in detail with the patient. Please refer to After Visit Summary for other counseling recommendations.   Return in about 1 week (around 04/17/2021).  Future Appointments  Date Time Provider [place]  04/20/2021  3:00 PM [facility] [place] [facility] None  04/20/2021  3:50 PM Roma Schanz, CNM [facility]    Janet Berlin, MD

## 2021-04-13 LAB — CULTURE, BETA STREP (GROUP B ONLY): Strep Gp B Culture: POSITIVE — AB

## 2021-04-15 LAB — CERVICOVAGINAL ANCILLARY ONLY
Chlamydia: NEGATIVE
Comment: NEGATIVE
Comment: NORMAL
Neisseria Gonorrhea: NEGATIVE

## 2021-04-17 ENCOUNTER — Other Ambulatory Visit: Payer: Self-pay | Admitting: Obstetrics & Gynecology

## 2021-04-17 DIAGNOSIS — O24410 Gestational diabetes mellitus in pregnancy, diet controlled: Secondary | ICD-10-CM

## 2021-04-20 ENCOUNTER — Ambulatory Visit (INDEPENDENT_AMBULATORY_CARE_PROVIDER_SITE_OTHER): Payer: 59 | Admitting: Women's Health

## 2021-04-20 ENCOUNTER — Ambulatory Visit (INDEPENDENT_AMBULATORY_CARE_PROVIDER_SITE_OTHER): Payer: 59

## 2021-04-20 ENCOUNTER — Encounter: Payer: Self-pay | Admitting: Women's Health

## 2021-04-20 ENCOUNTER — Other Ambulatory Visit: Payer: Self-pay

## 2021-04-20 VITALS — BP 122/72 | HR 86 | Wt 198.0 lb

## 2021-04-20 DIAGNOSIS — O0993 Supervision of high risk pregnancy, unspecified, third trimester: Secondary | ICD-10-CM

## 2021-04-20 DIAGNOSIS — Z3A37 37 weeks gestation of pregnancy: Secondary | ICD-10-CM | POA: Diagnosis not present

## 2021-04-20 DIAGNOSIS — Z3A36 36 weeks gestation of pregnancy: Secondary | ICD-10-CM

## 2021-04-20 DIAGNOSIS — O24410 Gestational diabetes mellitus in pregnancy, diet controlled: Secondary | ICD-10-CM

## 2021-04-20 DIAGNOSIS — Z98891 History of uterine scar from previous surgery: Secondary | ICD-10-CM

## 2021-04-20 DIAGNOSIS — F419 Anxiety disorder, unspecified: Secondary | ICD-10-CM

## 2021-04-20 LAB — POCT URINALYSIS DIPSTICK OB
Blood, UA: NEGATIVE
Glucose, UA: NEGATIVE
Ketones, UA: NEGATIVE
Leukocytes, UA: NEGATIVE
Nitrite, UA: NEGATIVE

## 2021-04-20 NOTE — Progress Notes (Signed)
[place] 79+8 wks,cephalic,anterior placenta gr 3,AFI 12.3 cm,normal ovaries,FHR 142 bpm,EFW 3442 g 76%

## 2021-04-20 NOTE — Patient Instructions (Signed)
Paula Houston, thank you for choosing our office today! We appreciate the opportunity to meet your healthcare needs. You may receive a short survey by mail, e-mail, or through [employer]. If you are happy with your care we would appreciate if you could take just a few minutes to complete the survey questions. We read all of your comments and take your feedback very seriously. Thank you again for choosing our office.  Center for [employer] Team at [place] [facility] ([place], [place] [address]) Entrance C, located off of [place] parking   CLASSES: Go to [email or web address] to register for classes (childbirth, breastfeeding, waterbirth, infant CPR, daddy bootcamp, etc.)  Call the office [phone]) or go to [facility] if: You begin to have strong, frequent contractions Your water breaks.  Sometimes it is a big gush of fluid, sometimes it is just a trickle that keeps getting your panties wet or running down your legs You have vaginal bleeding.  It is normal to have a small amount of spotting if your cervix was checked.  You don't feel your baby moving like normal.  If you don't, get you something to eat and drink and lay down and focus on feeling your baby move.   If your baby is still not moving like normal, you should call the office or go to [facility].  Call the office [phone]) or go to [facility] hospital for these signs of pre-eclampsia: Severe headache that does not go away with Tylenol Visual changes- seeing spots, double, blurred vision Pain under your right breast or upper abdomen that does not go away with Tums or heartburn medicine Nausea and/or vomiting Severe swelling in your hands, feet, and face   [facility] Pediatricians/Family Doctors [facility]): [address] [place], [place]: [address] [place], [place] [facility]): [place], [phone] (call to ask if accepting patients) [facility] Department: [address] [place], [place] [facility]): [address], [place] [facility]: [address] [place], [place] [facility]: [place], [facility]): [phone] [facility]: [address] [place]: [address] [facility]: [phone], [phone]  Home Blood Pressure Monitoring for Patients   Your provider has recommended that you check your blood pressure (BP) at least once a week at home. If you do not have a blood pressure cuff at home, one will be provided for you. Contact your provider if you have not received your monitor within 1 week.   Helpful Tips for Accurate Home Blood Pressure Checks  Don't smoke, exercise, or drink caffeine 30 minutes before checking your BP Use the restroom before checking your BP (a full bladder can raise your pressure) Relax in a comfortable upright chair Feet on the ground Left arm resting comfortably on a flat surface at the level of your heart Legs uncrossed Back supported Sit quietly and don't talk Place the cuff on your bare arm Adjust snuggly, so that only two fingertips  can fit between your skin and the top of the cuff Check 2 readings separated by at least one minute Keep a log of your BP readings For a visual, please reference this diagram: http://ccnc.care/bpdiagram  Provider Name: [facility]     Phone: [phone]  Zone 1: ALL CLEAR  Continue to monitor your symptoms:  BP reading is less than 140 (top number) or less than 90 (bottom number)  No right  upper stomach pain No headaches or seeing spots No feeling nauseated or throwing up No swelling in face and hands  Zone 2: CAUTION Call your doctor's office for any of the following:  BP reading is greater than 140 (top number) or greater than 90 (bottom number)  Stomach pain under your ribs in the middle or right side Headaches or seeing spots Feeling nauseated or throwing up Swelling in face and hands  Zone 3: EMERGENCY  Seek immediate medical care if you have any of the following:  BP reading is greater than160 (top number) or greater than 110 (bottom number) Severe headaches not improving with Tylenol Serious difficulty catching your breath Any worsening symptoms from Zone 2   Braxton Hicks Contractions Contractions of the uterus can occur throughout pregnancy, but they are not always a sign that you are in labor. You may have practice contractions called Braxton Hicks contractions. These false labor contractions are sometimes confused with true labor. What are [name] contractions? Braxton Hicks contractions are tightening movements that occur in the muscles of the uterus before labor. Unlike true labor contractions, these contractions do not result in opening (dilation) and thinning of the cervix. Toward the end of pregnancy (32-34 weeks), Braxton Hicks contractions can happen more often and may become stronger. These contractions are sometimes difficult to tell apart from true labor because they can be very uncomfortable. You should not feel embarrassed if you go to the hospital with false labor. Sometimes, the only way to tell if you are in true labor is for your health care provider to look for changes in the cervix. The health care provider will do a physical exam and may monitor your contractions. If you are not in true labor, the exam should show that your cervix is not dilating and your water has not broken. If there are no other health problems associated with your  pregnancy, it is completely safe for you to be sent home with false labor. You may continue to have Braxton Hicks contractions until you go into true labor. How to tell the difference between true labor and false labor True labor Contractions last 30-70 seconds. Contractions become very regular. Discomfort is usually felt in the top of the uterus, and it spreads to the lower abdomen and low back. Contractions do not go away with walking. Contractions usually become more intense and increase in frequency. The cervix dilates and gets thinner. False labor Contractions are usually shorter and not as strong as true labor contractions. Contractions are usually irregular. Contractions are often felt in the front of the lower abdomen and in the groin. Contractions may go away when you walk around or change positions while lying down. Contractions get weaker and are shorter-lasting as time goes on. The cervix usually does not dilate or become thin. Follow these instructions at home:  Take over-the-counter and prescription medicines only as told by your health care provider. Keep up with your usual exercises and follow other instructions from your health care provider. Eat and drink lightly if you think  you are going into labor. If Braxton Hicks contractions are making you uncomfortable: Change your position from lying down or resting to walking, or change from walking to resting. Sit and rest in a tub of warm water. Drink enough fluid to keep your urine pale yellow. Dehydration may cause these contractions. Do slow and deep breathing several times an hour. Keep all follow-up prenatal visits as told by your health care provider. This is important. Contact a health care provider if: You have a fever. You have continuous pain in your abdomen. Get help right away if: Your contractions become stronger, more regular, and closer together. You have fluid leaking or gushing from your vagina. You pass  blood-tinged mucus (bloody show). You have bleeding from your vagina. You have low back pain that you never had before. You feel your baby's head pushing down and causing pelvic pressure. Your baby is not moving inside you as much as it used to. Summary Contractions that occur before labor are called Braxton Hicks contractions, false labor, or practice contractions. Braxton Hicks contractions are usually shorter, weaker, farther apart, and less regular than true labor contractions. True labor contractions usually become progressively stronger and regular, and they become more frequent. Manage discomfort from [facility] contractions by changing position, resting in a warm bath, drinking plenty of water, or practicing deep breathing. This information is not intended to replace advice given to you by your health care provider. Make sure you discuss any questions you have with your health care provider. Document Revised: 09/09/2017 Document Reviewed: 02/10/2017 Elsevier Patient Education  [place].

## 2021-04-20 NOTE — Progress Notes (Signed)
[facility] PREGNANCY VISIT Patient name: Paula Houston MRN [id number]  Date of birth: [birth year] Chief Complaint:   Routine Prenatal Visit  History of Present Illness:   Paula Houston is a 25 y.o. G2P2002 female at [redacted]w[redacted]d with an Estimated Date of Delivery: 05/07/21 being seen today for ongoing management of a high-risk pregnancy complicated by diabetes mellitus A1DM.    Today she reports  all sugars wnl except one 2hr pp 140 . Contractions: Irritability. Vag. Bleeding: None.  Movement: Present. denies leaking of fluid.   Depression screen [facility] 2/9 02/18/2021 02/11/2021 10/29/2020 09/18/2019 09/05/2017  Decreased Interest 0 0 0 0 0  Down, Depressed, Hopeless 0 0 0 0 0  PHQ - 2 Score 0 0 0 0 0  Altered sleeping - 0 1 - -  Tired, decreased energy - 2 1 - -  Change in appetite - 0 1 - -  Feeling bad or failure about yourself  - 0 0 - -  Trouble concentrating - 0 0 - -  Moving slowly or fidgety/restless - 0 0 - -  Suicidal thoughts - 0 0 - -  PHQ-9 Score - 2 3 - -     GAD 7 : Generalized Anxiety Score 02/11/2021 10/29/2020  Nervous, Anxious, on Edge 0 1  Control/stop worrying 0 0  Worry too much - different things 0 0  Trouble relaxing 0 0  Restless 0 0  Easily annoyed or irritable 0 1  Afraid - awful might happen 0 0  Total GAD 7 Score 0 2     Review of Systems:   Pertinent items are noted in HPI Denies abnormal vaginal discharge w/ itching/odor/irritation, headaches, visual changes, shortness of breath, chest pain, abdominal pain, severe nausea/vomiting, or problems with urination or bowel movements unless otherwise stated above. Pertinent History Reviewed:  Reviewed past medical,surgical, social, obstetrical and family history.  Reviewed problem list, medications and allergies. Physical Assessment:   Vitals:   04/20/21 1545  BP: 122/72  Pulse: 86  Weight: 198 lb (89.8 kg)  Body mass index is 31.96 kg/m.           Physical Examination:   General appearance: alert,  well appearing, and in no distress  Mental status: alert, oriented to person, place, and time  Skin: warm & dry   Extremities: Edema: Trace    Cardiovascular: normal heart rate noted  Respiratory: normal respiratory effort, no distress  Abdomen: gravid, soft, non-tender  Pelvic: Cervical exam performed  Dilation: 3 Effacement (%): 50 Station: Ballotable  Fetal Status: Fetal Heart Rate (bpm): 142 u/s   Movement: Present Presentation: Vertex  Fetal Surveillance Testing today:  [place] 09+7 wks,cephalic,anterior placenta gr 3,AFI 12.3 cm,normal ovaries,FHR 142 bpm,EFW 3442 g 76%  Chaperone: Celene Squibb    Results for orders placed or performed in visit on 04/20/21 (from the past 24 hour(s))  POC Urinalysis Dipstick OB   Collection Time: 04/20/21  3:48 PM  Result Value Ref Range   Color, UA     Clarity, UA     Glucose, UA Negative Negative   Bilirubin, UA     Ketones, UA neg    Spec Grav, UA     Blood, UA neg    pH, UA     POC,PROTEIN,UA Trace Negative, Trace, Small (1+), Moderate (2+), Large (3+), 4+   Urobilinogen, UA     Nitrite, UA neg    Leukocytes, UA Negative Negative   Appearance     Odor  Assessment & Plan:  High-risk pregnancy: G3P2002 at [redacted]w[redacted]d with an Estimated Date of Delivery: 05/07/21   1) A1DM, stable, EFW today   2) Prev vag then c/s for breech, for TOLAC, consent signed 6/10  Meds: No orders of the defined types were placed in this encounter.   Labs/procedures today: SVE  Treatment Plan:     Deliver @ 39-40wks:____   Reviewed: Preterm labor symptoms and general obstetric precautions including but not limited to vaginal bleeding, contractions, leaking of fluid and fetal movement were reviewed in detail with the patient.  All questions were answered. Does have home bp cuff. Office bp cuff given: not applicable. Check bp weekly, let us know if consistently >140 and/or >90.  Follow-up: Return in about 1 day (around 04/21/2021) for [place], MD or CNM, in  person.   No future appointments.  Orders Placed This Encounter  Procedures   POC Urinalysis Dipstick OB   Roma Schanz CNM, [facility] 04/20/2021 4:13 PM

## 2021-04-28 ENCOUNTER — Ambulatory Visit (INDEPENDENT_AMBULATORY_CARE_PROVIDER_SITE_OTHER): Payer: 59 | Admitting: Women's Health

## 2021-04-28 ENCOUNTER — Other Ambulatory Visit: Payer: Self-pay

## 2021-04-28 ENCOUNTER — Encounter: Payer: Self-pay | Admitting: Women's Health

## 2021-04-28 VITALS — BP 121/74 | HR 88 | Wt 199.5 lb

## 2021-04-28 DIAGNOSIS — Z3A38 38 weeks gestation of pregnancy: Secondary | ICD-10-CM

## 2021-04-28 DIAGNOSIS — O0993 Supervision of high risk pregnancy, unspecified, third trimester: Secondary | ICD-10-CM

## 2021-04-28 DIAGNOSIS — O24410 Gestational diabetes mellitus in pregnancy, diet controlled: Secondary | ICD-10-CM

## 2021-04-28 LAB — POCT URINALYSIS DIPSTICK OB
Blood, UA: NEGATIVE
Glucose, UA: NEGATIVE
Ketones, UA: NEGATIVE
Nitrite, UA: NEGATIVE
POC,PROTEIN,UA: NEGATIVE

## 2021-04-28 NOTE — Patient Instructions (Addendum)
Paula Houston, thank you for choosing our office today! We appreciate the opportunity to meet your healthcare needs. You may receive a short survey by mail, e-mail, or through [employer]. If you are happy with your care we would appreciate if you could take just a few minutes to complete the survey questions. We read all of your comments and take your feedback very seriously. Thank you again for choosing our office.  Center for [employer] Team at [place] at [facility] ([place], [place] [address]) Entrance C, located off of [place] parking   Your induction is scheduled for [employer] 7/28. Please DO NOT show up at the time you see in [facility]. Someone from [facility] will call you on the date of your induction to let you know what time to come in. Please keep your phone on and with you at all times, you have 1 hour to respond to them to let them know you are on your way.  Go to the main desk at the [place] and let them know you are there to be induced. They will send someone from Labor & Delivery to come get you.  You will get a call from a nurse from the hospital within the next day or so to go over some information, she will also schedule you to go to the hospital a few days before you induction to have your Covid test. If you have any questions, please let us know.    CLASSES: Go to [email or web address] to register for classes (childbirth, breastfeeding, waterbirth, infant CPR, daddy bootcamp, etc.)  Call the office [phone]) or go to [facility] if: You begin to have strong, frequent contractions Your water breaks.  Sometimes it is a big gush of fluid, sometimes it is just a trickle that keeps getting your panties wet or running down your legs You have vaginal bleeding.  It is normal to have a small amount of spotting if your cervix was checked.  You don't feel your baby moving like normal.  If you don't,  get you something to eat and drink and lay down and focus on feeling your baby move.   If your baby is still not moving like normal, you should call the office or go to [facility].  Call the office [phone]) or go to [facility] hospital for these signs of pre-eclampsia: Severe headache that does not go away with Tylenol Visual changes- seeing spots, double, blurred vision Pain under your right breast or upper abdomen that does not go away with Tums or heartburn medicine Nausea and/or vomiting Severe swelling in your hands, feet, and face   [facility] Pediatricians/Family Doctors [facility]): [address] [place], [place]: [address] [place], [place] [facility]): [place], [phone] (call to ask if accepting patients) [facility] Department: [address] [place], [facility]): [address], [place] [facility]: [address] [place], [place] [facility]: [place], [facility]): [phone] [facility]: [address], [place]:  [address] [place] [facility]: [phone], [phone]  Home Blood Pressure Monitoring for Patients   Your provider has recommended that you check your blood pressure (BP) at least once a week at home. If you do not have a blood pressure cuff at home, one will be provided for you. Contact your provider if you have not received your monitor within 1 week.   Helpful Tips for Accurate Home Blood Pressure Checks  Don't smoke, exercise, or drink caffeine 30 minutes before checking your  BP Use the restroom before checking your BP (a full bladder can raise your pressure) Relax in a comfortable upright chair Feet on the ground Left arm resting comfortably on a flat surface at the level of your heart Legs uncrossed Back supported Sit quietly and don't talk Place the cuff on your bare arm Adjust snuggly, so that only two fingertips can fit between your skin and the top of the cuff Check 2 readings separated by at least one minute Keep a log of your BP readings For a visual, please reference this diagram: http://ccnc.care/bpdiagram  Provider Name: [facility]     Phone: [phone]  Zone 1: ALL CLEAR  Continue to monitor your symptoms:  BP reading is less than 140 (top number) or less than 90 (bottom number)  No right upper stomach pain No headaches or seeing spots No feeling nauseated or throwing up No swelling in face and hands  Zone 2: CAUTION Call your doctor's office for any of the following:  BP reading is greater than 140 (top number) or greater than 90 (bottom number)  Stomach pain under your ribs in the middle or right side Headaches or seeing spots Feeling nauseated or throwing up Swelling in face and hands  Zone 3: EMERGENCY  Seek immediate medical care if you have any of the following:  BP reading is greater than160 (top number) or greater than 110 (bottom number) Severe headaches not improving with Tylenol Serious difficulty catching your breath Any worsening symptoms from Zone 2   Braxton Hicks Contractions Contractions of the uterus can occur throughout pregnancy, but they are not always a sign that you are in labor. You may have practice contractions called Braxton Hicks contractions. These false labor contractions are sometimes confused with true labor. What are [name] contractions? Braxton Hicks contractions are tightening movements that occur in the muscles of the uterus before labor. Unlike true labor contractions, these  contractions do not result in opening (dilation) and thinning of the cervix. Toward the end of pregnancy (32-34 weeks), Braxton Hicks contractions can happen more often and may become stronger. These contractions are sometimes difficult to tell apart from true labor because they can be very uncomfortable. You should not feel embarrassed if you go to the hospital with false labor. Sometimes, the only way to tell if you are in true labor is for your health care provider to look for changes in the cervix. The health care provider will do a physical exam and may monitor your contractions. If you are not in true labor, the exam should show that your cervix is not dilating and your water has not broken. If there are no other health problems associated with your pregnancy, it is completely safe for you to be sent home with false labor. You may continue to have Braxton Hicks contractions until you go into true labor. How to tell the difference between true labor and false  labor True labor Contractions last 30-70 seconds. Contractions become very regular. Discomfort is usually felt in the top of the uterus, and it spreads to the lower abdomen and low back. Contractions do not go away with walking. Contractions usually become more intense and increase in frequency. The cervix dilates and gets thinner. False labor Contractions are usually shorter and not as strong as true labor contractions. Contractions are usually irregular. Contractions are often felt in the front of the lower abdomen and in the groin. Contractions may go away when you walk around or change positions while lying down. Contractions get weaker and are shorter-lasting as time goes on. The cervix usually does not dilate or become thin. Follow these instructions at home:  Take over-the-counter and prescription medicines only as told by your health care provider. Keep up with your usual exercises and follow other instructions from your health  care provider. Eat and drink lightly if you think you are going into labor. If Braxton Hicks contractions are making you uncomfortable: Change your position from lying down or resting to walking, or change from walking to resting. Sit and rest in a tub of warm water. Drink enough fluid to keep your urine pale yellow. Dehydration may cause these contractions. Do slow and deep breathing several times an hour. Keep all follow-up prenatal visits as told by your health care provider. This is important. Contact a health care provider if: You have a fever. You have continuous pain in your abdomen. Get help right away if: Your contractions become stronger, more regular, and closer together. You have fluid leaking or gushing from your vagina. You pass blood-tinged mucus (bloody show). You have bleeding from your vagina. You have low back pain that you never had before. You feel your baby's head pushing down and causing pelvic pressure. Your baby is not moving inside you as much as it used to. Summary Contractions that occur before labor are called Braxton Hicks contractions, false labor, or practice contractions. Braxton Hicks contractions are usually shorter, weaker, farther apart, and less regular than true labor contractions. True labor contractions usually become progressively stronger and regular, and they become more frequent. Manage discomfort from [facility] contractions by changing position, resting in a warm bath, drinking plenty of water, or practicing deep breathing. This information is not intended to replace advice given to you by your health care provider. Make sure you discuss any questions you have with your health care provider. Document Revised: 09/09/2017 Document Reviewed: 02/10/2017 Elsevier Patient Education  [place].

## 2021-04-28 NOTE — Progress Notes (Signed)
[place] PREGNANCY VISIT Patient name: Paula Houston MRN [id number]  Date of birth: 27-Oct-1995 Chief Complaint:   High Risk Gestation  History of Present Illness:   Paula Houston is a 25 y.o. G29P2002 female at [redacted]w[redacted]d with an Estimated Date of Delivery: 05/07/21 being seen today for ongoing management of a high-risk pregnancy complicated by diabetes mellitus A1DM.    Today she reports  all sugars wnl except 2 of her pp dinner (132, 133) was helping mom move and ate fast food . Contractions: Irregular. Vag. Bleeding: None.  Movement: Present. denies leaking of fluid.   Depression screen [facility] 2/9 02/18/2021 02/11/2021 10/29/2020 09/18/2019 09/05/2017  Decreased Interest 0 0 0 0 0  Down, Depressed, Hopeless 0 0 0 0 0  PHQ - 2 Score 0 0 0 0 0  Altered sleeping - 0 1 - -  Tired, decreased energy - 2 1 - -  Change in appetite - 0 1 - -  Feeling bad or failure about yourself  - 0 0 - -  Trouble concentrating - 0 0 - -  Moving slowly or fidgety/restless - 0 0 - -  Suicidal thoughts - 0 0 - -  PHQ-9 Score - 2 3 - -     GAD 7 : Generalized Anxiety Score 02/11/2021 10/29/2020  Nervous, Anxious, on Edge 0 1  Control/stop worrying 0 0  Worry too much - different things 0 0  Trouble relaxing 0 0  Restless 0 0  Easily annoyed or irritable 0 1  Afraid - awful might happen 0 0  Total GAD 7 Score 0 2     Review of Systems:   Pertinent items are noted in HPI Denies abnormal vaginal discharge w/ itching/odor/irritation, headaches, visual changes, shortness of breath, chest pain, abdominal pain, severe nausea/vomiting, or problems with urination or bowel movements unless otherwise stated above. Pertinent History Reviewed:  Reviewed past medical,surgical, social, obstetrical and family history.  Reviewed problem list, medications and allergies. Physical Assessment:   Vitals:   04/28/21 1152  BP: 121/74  Pulse: 88  Weight: 199 lb 8 oz (90.5 kg)  Body mass index is 32.2 kg/m.            Physical Examination:   General appearance: alert, well appearing, and in no distress  Mental status: alert, oriented to person, place, and time  Skin: warm & dry   Extremities: Edema: Trace    Cardiovascular: normal heart rate noted  Respiratory: normal respiratory effort, no distress  Abdomen: gravid, soft, non-tender  Pelvic: Cervical exam performed  Dilation: 4 Effacement (%): 50 Station: -2  Fetal Status: Fetal Heart Rate (bpm): 143 Fundal Height: 36 cm Movement: Present Presentation: Vertex  Fetal Surveillance Testing today: doppler   Chaperone:  Vicente Males, MS     Results for orders placed or performed in visit on 04/28/21 (from the past 24 hour(s))  POC Urinalysis Dipstick OB   Collection Time: 04/28/21 11:54 AM  Result Value Ref Range   Color, UA     Clarity, UA     Glucose, UA Negative Negative   Bilirubin, UA     Ketones, UA neg    Spec Grav, UA     Blood, UA neg    pH, UA     POC,PROTEIN,UA Negative Negative, Trace, Small (1+), Moderate (2+), Large (3+), 4+   Urobilinogen, UA     Nitrite, UA neg    Leukocytes, UA Small (1+) (A) Negative   Appearance  Odor      Assessment & Plan:  High-risk pregnancy: G3P2002 at [redacted]w[redacted]d with an Estimated Date of Delivery: 05/07/21   1) A1DM, stable, EFW 76% @ 37wk, extrapolates to 3950g @ 40wk  2) Prev c/s, SVB then C/S for breech, wants TOLAC  Meds: No orders of the defined types were placed in this encounter.   Labs/procedures today: SVE  Treatment Plan:  IOL scheduled for 7/28 AM,  IOL form faxed via Epic and orders placed   Reviewed: Term labor symptoms and general obstetric precautions including but not limited to vaginal bleeding, contractions, leaking of fluid and fetal movement were reviewed in detail with the patient.  All questions were answered. Does have home bp cuff. Office bp cuff given: not applicable. Check bp weekly, let us know if consistently >140 and/or >90.  Follow-up: Return in about 1 week (around  05/05/2021) for [place], [place], in person.   Future Appointments  Date Time Provider [place]  05/04/2021 11:10 AM Janyth Pupa, DO [facility]  05/07/2021  6:30 AM [facility] [place] [facility] None    Orders Placed This Encounter  Procedures   POC Urinalysis Dipstick OB   Roma Schanz CNM, [facility] 04/28/2021 12:18 PM

## 2021-04-29 ENCOUNTER — Other Ambulatory Visit: Payer: Self-pay | Admitting: Advanced Practice Midwife

## 2021-04-30 ENCOUNTER — Telehealth (HOSPITAL_COMMUNITY): Payer: Self-pay | Admitting: *Deleted

## 2021-04-30 ENCOUNTER — Encounter (HOSPITAL_COMMUNITY): Payer: Self-pay | Admitting: *Deleted

## 2021-04-30 NOTE — Telephone Encounter (Signed)
Preadmission screen  

## 2021-05-03 ENCOUNTER — Encounter (HOSPITAL_COMMUNITY): Payer: Self-pay | Admitting: Obstetrics and Gynecology

## 2021-05-03 ENCOUNTER — Other Ambulatory Visit: Payer: Self-pay

## 2021-05-03 ENCOUNTER — Inpatient Hospital Stay (HOSPITAL_COMMUNITY): Payer: 59 | Admitting: Anesthesiology

## 2021-05-03 ENCOUNTER — Inpatient Hospital Stay (HOSPITAL_COMMUNITY)
Admission: AD | Admit: 2021-05-03 | Discharge: 2021-05-05 | DRG: 805 | Disposition: A | Discharge status: Home / Self Care | Payer: 59 | Attending: Obstetrics & Gynecology | Admitting: Obstetrics & Gynecology

## 2021-05-03 DIAGNOSIS — O24410 Gestational diabetes mellitus in pregnancy, diet controlled: Secondary | ICD-10-CM | POA: Diagnosis present

## 2021-05-03 DIAGNOSIS — O34211 Maternal care for low transverse scar from previous cesarean delivery: Secondary | ICD-10-CM | POA: Diagnosis not present

## 2021-05-03 DIAGNOSIS — Z3A39 39 weeks gestation of pregnancy: Secondary | ICD-10-CM

## 2021-05-03 DIAGNOSIS — O4593 Premature separation of placenta, unspecified, third trimester: Secondary | ICD-10-CM | POA: Diagnosis present

## 2021-05-03 DIAGNOSIS — Z20822 Contact with and (suspected) exposure to covid-19: Secondary | ICD-10-CM | POA: Diagnosis present

## 2021-05-03 DIAGNOSIS — O99824 Streptococcus B carrier state complicating childbirth: Secondary | ICD-10-CM | POA: Diagnosis present

## 2021-05-03 DIAGNOSIS — O0993 Supervision of high risk pregnancy, unspecified, third trimester: Secondary | ICD-10-CM

## 2021-05-03 DIAGNOSIS — O99820 Streptococcus B carrier state complicating pregnancy: Secondary | ICD-10-CM | POA: Diagnosis not present

## 2021-05-03 DIAGNOSIS — O24420 Gestational diabetes mellitus in childbirth, diet controlled: Principal | ICD-10-CM | POA: Diagnosis present

## 2021-05-03 DIAGNOSIS — O458X3 Other premature separation of placenta, third trimester: Secondary | ICD-10-CM | POA: Diagnosis not present

## 2021-05-03 DIAGNOSIS — O26893 Other specified pregnancy related conditions, third trimester: Secondary | ICD-10-CM | POA: Diagnosis present

## 2021-05-03 DIAGNOSIS — F419 Anxiety disorder, unspecified: Secondary | ICD-10-CM

## 2021-05-03 LAB — RESP PANEL BY RT-PCR (FLU A&B, COVID) ARPGX2
Influenza A by PCR: NEGATIVE
Influenza B by PCR: NEGATIVE
SARS Coronavirus 2 by RT PCR: NEGATIVE

## 2021-05-03 LAB — COMPREHENSIVE METABOLIC PANEL
ALT: 8 U/L (ref 0–44)
AST: 15 U/L (ref 15–41)
Albumin: 2.5 g/dL — ABNORMAL LOW (ref 3.5–5.0)
Alkaline Phosphatase: 123 U/L (ref 38–126)
Anion gap: 6 (ref 5–15)
BUN: 7 mg/dL (ref 6–20)
CO2: 22 mmol/L (ref 22–32)
Calcium: 8.6 mg/dL — ABNORMAL LOW (ref 8.9–10.3)
Chloride: 106 mmol/L (ref 98–111)
Creatinine, Ser: 0.59 mg/dL (ref 0.44–1.00)
GFR, Estimated: >60 mL/min (ref >60)
Glucose, Bld: 84 mg/dL (ref 70–99)
Potassium: 4.5 mmol/L (ref 3.5–5.1)
Sodium: 134 mmol/L — ABNORMAL LOW (ref 135–145)
Total Bilirubin: 0.6 mg/dL (ref 0.3–1.2)
Total Protein: 5.7 g/dL — ABNORMAL LOW (ref 6.5–8.1)

## 2021-05-03 LAB — CBC
HCT: 33.1 % — ABNORMAL LOW (ref 36.0–46.0)
Hemoglobin: 11 g/dL — ABNORMAL LOW (ref 12.0–15.0)
MCH: 30.1 pg (ref 26.0–34.0)
MCHC: 33.2 g/dL (ref 30.0–36.0)
MCV: 90.4 fL (ref 80.0–100.0)
Platelets: 359 10*3/uL (ref 150–400)
RBC: 3.66 MIL/uL — ABNORMAL LOW (ref 3.87–5.11)
RDW: 13.8 % (ref 11.5–15.5)
WBC: 11.9 10*3/uL — ABNORMAL HIGH (ref 4.0–10.5)
nRBC: 0 % (ref 0.0–0.2)

## 2021-05-03 LAB — TYPE AND SCREEN
ABO/RH(D): A POS
Antibody Screen: NEGATIVE

## 2021-05-03 LAB — GLUCOSE, CAPILLARY
Glucose-Capillary: 78 mg/dL (ref 70–99)
Glucose-Capillary: 79 mg/dL (ref 70–99)
Glucose-Capillary: 80 mg/dL (ref 70–99)

## 2021-05-03 LAB — RPR: RPR Ser Ql: NONREACTIVE

## 2021-05-03 MED ORDER — LACTATED RINGERS IV SOLN: 500.0000 mL | INTRAVENOUS | Status: DC | PRN

## 2021-05-03 MED ORDER — PENICILLIN G POTASSIUM 5000000 UNITS IJ SOLR: 5.0000 10*6.[IU] | Freq: Once | INTRAMUSCULAR | Status: DC

## 2021-05-03 MED ORDER — ACETAMINOPHEN 325 MG PO TABS
650.0000 mg | ORAL_TABLET | ORAL | Status: DC | PRN
  Administered 2021-05-03: 650 mg via ORAL
  Filled 2021-05-03: qty 2

## 2021-05-03 MED ORDER — PHENYLEPHRINE 40 MCG/ML (10ML) SYRINGE FOR IV PUSH (FOR BLOOD PRESSURE SUPPORT)
80.0000 ug | PREFILLED_SYRINGE | INTRAVENOUS | Status: DC | PRN
Start: 2021-05-03 — End: 2021-05-04

## 2021-05-03 MED ORDER — LACTATED RINGERS IV SOLN: INTRAVENOUS | Status: DC

## 2021-05-03 MED ORDER — DIPHENHYDRAMINE HCL 50 MG/ML IJ SOLN
12.5000 mg | INTRAMUSCULAR | Status: DC | PRN
Start: 2021-05-03 — End: 2021-05-04
  Administered 2021-05-03: 12.5 mg via INTRAVENOUS
  Filled 2021-05-03: qty 1

## 2021-05-03 MED ORDER — LACTATED RINGERS IV SOLN
500.0000 mL | Freq: Once | INTRAVENOUS | Status: AC
  Administered 2021-05-03: 500 mL via INTRAVENOUS

## 2021-05-03 MED ORDER — EPHEDRINE 5 MG/ML INJ: 10.0000 mg | INTRAVENOUS | Status: DC | PRN

## 2021-05-03 MED ORDER — OXYTOCIN-SODIUM CHLORIDE 30-0.9 UT/500ML-% IV SOLN
2.5000 [IU]/h | INTRAVENOUS | Status: DC
  Administered 2021-05-04: 2.5 [IU]/h via INTRAVENOUS

## 2021-05-03 MED ORDER — OXYTOCIN BOLUS FROM INFUSION
333.0000 mL | Freq: Once | INTRAVENOUS | Status: AC
  Administered 2021-05-03: 333 mL via INTRAVENOUS

## 2021-05-03 MED ORDER — ONDANSETRON HCL 4 MG/2ML IJ SOLN
4.0000 mg | Freq: Four times a day (QID) | INTRAMUSCULAR | Status: DC | PRN
  Administered 2021-05-03: 4 mg via INTRAVENOUS
  Filled 2021-05-03: qty 2

## 2021-05-03 MED ORDER — PENICILLIN G POT IN DEXTROSE 60000 UNIT/ML IV SOLN
3.0000 10*6.[IU] | INTRAVENOUS | Status: DC
  Administered 2021-05-03 (×2): 3 10*6.[IU] via INTRAVENOUS
  Filled 2021-05-03 (×2): qty 50

## 2021-05-03 MED ORDER — FLEET ENEMA 7-19 GM/118ML RE ENEM: 1.0000 | ENEMA | RECTAL | Status: DC | PRN

## 2021-05-03 MED ORDER — SODIUM CHLORIDE 0.9 % IV SOLN
5.0000 10*6.[IU] | Freq: Once | INTRAVENOUS | Status: DC
  Filled 2021-05-03: qty 5

## 2021-05-03 MED ORDER — LIDOCAINE HCL (PF) 1 % IJ SOLN: 30.0000 mL | INTRAMUSCULAR | Status: DC | PRN

## 2021-05-03 MED ORDER — TERBUTALINE SULFATE 1 MG/ML IJ SOLN: 0.2500 mg | Freq: Once | INTRAMUSCULAR | Status: DC | PRN

## 2021-05-03 MED ORDER — LIDOCAINE HCL (PF) 1 % IJ SOLN
INTRAMUSCULAR | Status: DC | PRN
  Administered 2021-05-03: 2 mL via EPIDURAL
  Administered 2021-05-03: 10 mL via EPIDURAL

## 2021-05-03 MED ORDER — FENTANYL-BUPIVACAINE-NACL 0.5-0.125-0.9 MG/250ML-% EP SOLN
EPIDURAL | Status: DC | PRN
  Administered 2021-05-03: 12 mL/h via EPIDURAL

## 2021-05-03 MED ORDER — SOD CITRATE-CITRIC ACID 500-334 MG/5ML PO SOLN: 30.0000 mL | ORAL | Status: DC | PRN

## 2021-05-03 MED ORDER — FENTANYL-BUPIVACAINE-NACL 0.5-0.125-0.9 MG/250ML-% EP SOLN
12.0000 mL/h | EPIDURAL | Status: DC | PRN
Start: 2021-05-03 — End: 2021-05-04
  Filled 2021-05-03: qty 250

## 2021-05-03 MED ORDER — SODIUM CHLORIDE 0.9 % IV SOLN
5.0000 10*6.[IU] | Freq: Once | INTRAVENOUS | Status: AC
  Administered 2021-05-03: 5 10*6.[IU] via INTRAVENOUS
  Filled 2021-05-03: qty 5

## 2021-05-03 MED ORDER — OXYTOCIN-SODIUM CHLORIDE 30-0.9 UT/500ML-% IV SOLN
1.0000 m[IU]/min | INTRAVENOUS | Status: DC
  Administered 2021-05-03: 2 m[IU]/min via INTRAVENOUS
  Filled 2021-05-03: qty 500

## 2021-05-03 NOTE — Progress Notes (Signed)
Labor Progress Note Paula Houston is a 25 y.o. G3P2002 at 57w3dpresented for early labor. Hx complicated by TOLAC and A1GDM S:  Feeling comfortable, having some increased pain with contractions despite epidural but not having pain between contractions. Is able to push the button and get relief  O:  BP 105/63   Pulse 71   Temp 98.1 F (36.7 C) (Axillary)   Resp 16   Ht '5\' 6"'$  (1.676 m)   Wt 90.3 kg   LMP 07/31/2020   SpO2 99%   BMI 32.12 kg/m  EFM: 150/moderate variability/Positive acels, mild variables after AROM  CVE: Dilation: 5 Effacement (%): 60 Cervical Position: Anterior Station: -2 Presentation: Vertex Exam by:: VMarlou PorchCNM   A&P: 25y.o. GJK:[id number] labor with hx TOLAC and A1GDM #Labor: Early labor without progression. Having increased vaginal bleeding than expected with bloody show but no loss of station. Not having abdominal pain outside contractions. AROM for clear fluid to augment labor. Discussed case with Dr ErRip Harbournd will continue to monitor as fetal status is reassuring and hold off on drug augmentation at this time #Pain: Epidural in place #FWB: Cat 1 #GBS positive, PCN running #TOLAC : Consents signed. Having some increased vaginal bleeding but not heavy, no clots and no loss of station. Continue to monitor closely #A1GDM: diet controlled  AnWaldon MerlMD 4:33 PM

## 2021-05-03 NOTE — H&P (Addendum)
OBSTETRIC ADMISSION HISTORY AND PHYSICAL  Paula Houston is a 25 y.o. female G7P2002 with IUP at 13w3dby LMP/6wk [place] for contractions and vaginal bleeding. She states she has been having irregular contractions for 2 weeks but this morning they became more regular and q6 minutes. She reports +FMs, No LOF, no VB, no blurry vision, headaches or peripheral edema, and RUQ pain.  She plans on breast feeding. She is undecided for birth control. [facility]  Dating: By LMP c/w 6 wk [place]--->  Estimated Date of Delivery: 05/07/21  Sono:    _0 , CWD, normal anatomy, cephalic presentation, anterior placenta, 3442g, 76% EFW   [facility]  LAB RESULTS  Language English Pap 10/29/20: neg  Initiated care at 12wks GC/CT Initial: -/-           36wks:  Dating by LMP c/w 6wk U/S    Support person  Genetics NT/IT: neg/neg   Panorama: low risk female    Carrier Screen   Flu vaccine Info given [place]/Hgb Elec   TDaP vaccine 03/04/2021    Rhogam n/a Blood Type A/Positive/-- (01/19 1107)    Antibody Negative (01/19 1107)  Anatomy [place]Nl female HBsAg Negative (01/19 1107)  Feeding Plan breast RPR Non Reactive (01/19 1107)  Contraception Would like Nuvaring, issue rev'd Rubella  2.19 (01/19 1107)  [place] (01/19 1107)  Pediatrician [facility] on [place] Hep C neg  Prenatal Classes       A1C/GTT Early:      26-28wks:86/201/154  BTL Consent     VBAC Consent  GBS      pos  _1  PCN allergy  Waterbirth _2 Class _3 Consent _4 CNM visit     Prenatal History/Complications:  -PCOS -Hx Cesarean section -Anxiety -A1GDM  Past Medical History: Past Medical History:  Diagnosis Date   Anxiety    Gestational diabetes    Medical history non-contributory    Painful menstrual periods 06/04/2014   PCOS (polycystic ovarian syndrome)     Past Surgical History: Past Surgical History:  Procedure Laterality Date   CESAREAN SECTION N/A  12/25/2016   Procedure: CESAREAN SECTION;  Surgeon: CLavonia Drafts MD;  Location: [facility]  Service: Obstetrics;  Laterality: N/A;   WISDOM TOOTH EXTRACTION      Obstetrical History: OB History     Gravida  3   Para  2   Term  2   Preterm  0   AB  0   Living  2      SAB  0   IAB  0   Ectopic  0   Multiple  0   Live Births  2           Social History Social History   Socioeconomic History   Marital status: Married    Spouse name: Not on file   Number of children: Not on file   Years of education: Not on file   Highest education level: Not on file  Occupational History   Not on file  Tobacco Use   Smoking status: Never   Smokeless tobacco: Never  Vaping Use   Vaping Use: Never used  Substance and Sexual Activity   Alcohol use: No   Drug use: No   Sexual activity: Yes    Birth control/protection: None  Other Topics Concern   Not on file  Social History Narrative   Not on file  Social Determinants of Health   Financial Resource Strain: Low Risk    Difficulty of Paying Living Expenses: Not hard at all  Food Insecurity: No Food Insecurity   Worried About Charity fundraiser in the Last Year: Never true   [place] in the Last Year: Never true  Transportation Needs: No Transportation Needs   Lack of Transportation (Medical): No   Lack of Transportation (Non-Medical): No  Physical Activity: Inactive   Days of Exercise per Week: 0 days   Minutes of Exercise per Session: 0 min  Stress: No Stress Concern Present   Feeling of Stress : Not at all  Social Connections: Moderately Isolated   Frequency of Communication with Friends and Family: More than three times a week   Frequency of Social Gatherings with Friends and Family: Once a week   Attends Religious Services: Never   Marine scientist or Organizations: No   Attends Music therapist: Never   Marital Status: Married    Family History: Family  History  Problem Relation Age of Onset   COPD Paternal Grandmother    Diabetes Mother    Diabetes Brother     Allergies: Allergies  Allergen Reactions   Codeine Nausea And Vomiting    Medications Prior to Admission  Medication Sig Dispense Refill Last Dose   Acetaminophen (TYLENOL) 325 MG CAPS Take by mouth as needed.   Past Month   Prenatal Vit-Fe Fumarate-FA (PRENATAL PO) Take by mouth.   05/02/2021   Accu-Chek Softclix Lancets lancets Use as instructed to check blood sugar 4 times daily 100 each 12    Blood Glucose Monitoring Suppl (ACCU-CHEK GUIDE ME) w/Device KIT 1 each by Does not apply route 4 (four) times daily. 1 kit 0    glucose blood (ACCU-CHEK GUIDE) test strip Use as instructed to check blood sugar 4 times daily 50 each 12      Review of Systems   All systems reviewed and negative except as stated in HPI  Blood pressure (!) 144/88, pulse 95, temperature 98.1 F (36.7 C), temperature source Axillary, resp. rate 16, height _0  (1.676 m), weight 90.3 kg, last menstrual period 07/31/2020, SpO2 99 %. General appearance: alert and cooperative Lungs: clear to auscultation bilaterally Heart: regular rate and rhythm Abdomen: soft, non-tender; bowel sounds normal Pelvic: 4/50/-2 Extremities: Homans sign is negative, no sign of DVT Presentation: cephalic Fetal monitoringBaseline: 135 bpm, Variability: Good {> 6 bpm), Accelerations: Reactive, and Decelerations: Absent Uterine activity: not tracing well Dilation: 5.5 Effacement (%): 70 Station: -2 Exam by:: Fredda Hammed RN   Prenatal labs: ABO, Rh: --/--/A POS (07/24 1204) Antibody: NEG (07/24 1204) Rubella: 2.19 (01/19 1107) RPR: Non Reactive (05/04 0855)  HBsAg: Negative (01/19 1107)  HIV: Non Reactive (05/04 0855)  GBS: Positive/-- (07/01 1330)  1 hr Glucola 154 Genetic screening  low risk Anatomy US normal  Prenatal Transfer Tool  Maternal Diabetes: Yes:  Diabetes Type:  Diet controlled Genetic  Screening: Normal Maternal Ultrasounds/Referrals: Normal Fetal Ultrasounds or other Referrals:  Referred to Materal Fetal Medicine  Maternal Substance Abuse:  No Significant Maternal Medications:  None Significant Maternal Lab Results: Group B Strep positive  Results for orders placed or performed during the hospital encounter of 05/03/21 (from the past 24 hour(s))  CBC   Collection Time: 05/03/21 12:04 PM  Result Value Ref Range   WBC 11.9 (H) 4.0 - 10.5 K/uL   RBC 3.66 (L) 3.87 - 5.11 MIL/uL  Hemoglobin 11.0 (L) 12.0 - 15.0 g/dL   HCT 33.1 (L) 36.0 - 46.0 %   MCV 90.4 80.0 - 100.0 fL   MCH 30.1 26.0 - 34.0 pg   MCHC 33.2 30.0 - 36.0 g/dL   RDW 13.8 11.5 - 15.5 %   Platelets 359 150 - 400 K/uL   nRBC 0.0 0.0 - 0.2 %  Type and screen [place]   Collection Time: 05/03/21 12:04 PM  Result Value Ref Range   ABO/RH(D) A POS    Antibody Screen NEG    Sample Expiration      05/06/2021,2359 Performed at [facility], [place] [address] [place], [place] [address]   Resp Panel by RT-PCR (Flu A&B, Covid) Nasopharyngeal Swab   Collection Time: 05/03/21 12:36 PM   Specimen: Nasopharyngeal Swab; Nasopharyngeal(NP) swabs in vial transport medium  Result Value Ref Range   SARS Coronavirus 2 by RT PCR NEGATIVE NEGATIVE   Influenza A by PCR NEGATIVE NEGATIVE   Influenza B by PCR NEGATIVE NEGATIVE  Glucose, capillary   Collection Time: 05/03/21 12:43 PM  Result Value Ref Range   Glucose-Capillary 78 70 - 99 mg/dL    Patient Active Problem List   Diagnosis Date Noted   Labor without complication 75/07/2584   Gestational diabetes 02/12/2021   Anxiety 02/11/2021   Encounter for supervision of high risk pregnancy in third trimester, antepartum 10/29/2020   History of cesarean section 12/28/2016   PCOS (polycystic ovarian syndrome) 06/08/2016   Painful menstrual periods 06/04/2014    Assessment/Plan:  IRAIDA CRAGIN is a 25 y.o. G3P2002 at 48w3dhere for  labor  #Labor:Admitted with labor, made change in triage without augmentation. Will augment as required #Pain: Tolerable, epidural as desired #FWB: Cat 1 #ID:  GBS pos, PCN running #MOF: Breast #MOC:undecided #Circ:  yes  AWaldon Merl MD  05/03/2021, 1:54 PM  I was present for the exam and agree with above. Elevated BP x 2. Will draw Pre-E labs.   STamala Julian V[place] CNorth [place]7/24/2022 4:15 PM

## 2021-05-03 NOTE — Progress Notes (Signed)
Labor Progress Note Paula Houston is a 25 y.o. G3P2002 at 57w3dpresented for early labor. TOLAC and A1GDM  S:  Patient comfortable with epidural.  O:  BP 119/80   Pulse 77   Temp 98.5 F (36.9 C) (Oral)   Resp 16   Ht '5\' 6"'$  (1.676 m)   Wt 90.3 kg   LMP 07/31/2020   SpO2 99%   BMI 32.12 kg/m   Fetal Tracing:  Baseline: 145 Variability: moderate Accels: 15x15 Decels: none  Toco: occasional uc's   CVE: Dilation: 6.5 Effacement (%): 100 Cervical Position: Anterior Station: -1 Presentation: Vertex Exam by:: CSharmon RevereCNM   A&P: 25y.o. GJK:[id number] labor, TOLAC, A1GDM #Labor: Cervix unchanged. Bleeding minimal. Will restart pitocin 2x2 #Pain: epidural #FWB: Cat 1 #GBS: GBS pos  CaWende MottCNM 9:18 PM

## 2021-05-03 NOTE — Anesthesia Preprocedure Evaluation (Addendum)
Anesthesia Evaluation  Patient identified by MRN, date of birth, ID band Patient awake    Reviewed: Allergy & Precautions, Patient's Chart, lab work & pertinent test results  Airway Mallampati: II  TM Distance: >3 FB Neck ROM: Full    Dental no notable dental hx.    Pulmonary neg pulmonary ROS,    Pulmonary exam normal breath sounds clear to auscultation       Cardiovascular negative cardio ROS Normal cardiovascular exam Rhythm:Regular Rate:Normal     Neuro/Psych negative neurological ROS  negative psych ROS   GI/Hepatic negative GI ROS, Neg liver ROS,   Endo/Other  diabetes, GestationalObesity BMI 32  Renal/GU negative Renal ROS  Female GU complaint     Musculoskeletal negative musculoskeletal ROS (+)   Abdominal   Peds negative pediatric ROS (+)  Hematology  (+) Blood dyscrasia, anemia , hct 33.1, plt 359   Anesthesia Other Findings   Reproductive/Obstetrics (+) Pregnancy TOLAC                            Anesthesia Physical Anesthesia Plan  ASA: 2  Anesthesia Plan: Epidural   Post-op Pain Management:    Induction:   PONV Risk Score and Plan: 2  Airway Management Planned: Natural Airway  Additional Equipment: None  Intra-op Plan:   Post-operative Plan:   Informed Consent: I have reviewed the patients History and Physical, chart, labs and discussed the procedure including the risks, benefits and alternatives for the proposed anesthesia with the patient or authorized representative who has indicated his/her understanding and acceptance.       Plan Discussed with:   Anesthesia Plan Comments:         Anesthesia Quick Evaluation

## 2021-05-03 NOTE — Anesthesia Procedure Notes (Signed)
Epidural Patient location during procedure: OB Start time: 05/03/2021 2:20 PM End time: 05/03/2021 2:31 PM  Staffing Anesthesiologist: Pervis Hocking, DO Performed: anesthesiologist   Preanesthetic Checklist Completed: patient identified, IV checked, risks and benefits discussed, monitors and equipment checked, pre-op evaluation and timeout performed  Epidural Patient position: sitting Prep: DuraPrep and site prepped and draped Patient monitoring: continuous pulse ox, blood pressure, heart rate and cardiac monitor Approach: midline Location: L3-L4 Injection technique: LOR air  Needle:  Needle type: Tuohy  Needle gauge: 17 G Needle length: 9 cm Needle insertion depth: 6 cm Catheter type: closed end flexible Catheter size: 19 Gauge Catheter at skin depth: 11 cm Test dose: negative  Assessment Sensory level: T8 Events: blood not aspirated, injection not painful, no injection resistance, no paresthesia and negative IV test  Additional Notes Patient identified. Risks/Benefits/Options discussed with patient including but not limited to bleeding, infection, nerve damage, paralysis, failed block, incomplete pain control, headache, blood pressure changes, nausea, vomiting, reactions to medication both or allergic, itching and postpartum back pain. Confirmed with bedside nurse the patient's most recent platelet count. Confirmed with patient that they are not currently taking any anticoagulation, have any bleeding history or any family history of bleeding disorders. Patient expressed understanding and wished to proceed. All questions were answered. Sterile technique was used throughout the entire procedure. Please see nursing notes for vital signs. Test dose was given through epidural catheter and negative prior to continuing to dose epidural or start infusion. Warning signs of high block given to the patient including shortness of breath, tingling/numbness in hands, complete motor  block, or any concerning symptoms with instructions to call for help. Patient was given instructions on fall risk and not to get out of bed. All questions and concerns addressed with instructions to call with any issues or inadequate analgesia.  Reason for block:procedure for pain

## 2021-05-03 NOTE — MAU Note (Signed)
Has been contracting "for forever and a day", they seem more intense now-feeling a lot of pressure, though still not consistent. Prior c/s (breech).  Has been having some bleeding, never had before.  Was 4 cm on Tues.

## 2021-05-03 NOTE — Discharge Summary (Addendum)
Postpartum Discharge Summary  Date of Service updated: 05/05/2021     Patient Name: Paula Houston DOB: 29-Oct-1995 MRN: [id number]  Date of admission: 05/03/2021 Delivery date:05/03/2021  Delivering provider: Wende Mott  Date of discharge: 05/05/2021  Admitting diagnosis: Labor without complication [N79] Gestational diabetes mellitus, class A1 [O24.410] Intrauterine pregnancy: [redacted]w[redacted]d    Secondary diagnosis:  Active Problems:   Labor without complication   Gestational diabetes mellitus, class A1  Additional problems: likely placenta abruption    Discharge diagnosis: Term Pregnancy Delivered, GDM A1, and TOLAC                                               Post partum procedures: N/A Augmentation: AROM and Pitocin Complications: Placental Abruption  Hospital course: Onset of Labor With Vaginal Delivery      25y.o. yo [id number] admitted in Latent Labor on 05/03/2021. Patient had an uncomplicated labor course as follows: She arrived at 4cm, was AROM with some bright red bleeding, started pitocin and rapidly progressed to complete.  Membrane Rupture Time/Date: 4:25 PM ,05/03/2021   Delivery Method:Vaginal, Spontaneous  Episiotomy: None  Lacerations:  2nd degree  Patient had an uncomplicated postpartum course.  She is ambulating, tolerating a regular diet, passing flatus, and urinating well. Patient is discharged home in stable condition on 05/05/21.  Newborn Data: Birth date:05/03/2021  Birth time:11:35 PM  Gender:Female  Living status:Living  Apgars:8 ,9  Weight:3544 g   Magnesium Sulfate received: No BMZ received: No Rhophylac:N/A MMR:N/A T-DaP:Given prenatally Flu: No Transfusion:No  Physical exam  Vitals:   05/04/21 1452 05/04/21 1826 05/04/21 2103 05/05/21 0507  BP: 115/73 (!) 92/59 103/62 (!) 89/51  Pulse: 73 79 66 65  Resp: 17 18 18 18   Temp: 98.4 F (36.9 C) 98 F (36.7 C) 98.4 F (36.9 C) 98.2 F (36.8 C)  TempSrc: Axillary Axillary  Oral Oral  SpO2: 99%     Weight:      Height:       General: alert, cooperative, and no distress Lochia: appropriate Uterine Fundus: firm Incision: N/A DVT Evaluation: No evidence of DVT seen on physical exam. Labs: Lab Results  Component Value Date   WBC 18.9 (H) 05/04/2021   HGB 9.9 (L) 05/04/2021   HCT 30.5 (L) 05/04/2021   MCV 91.3 05/04/2021   PLT 333 05/04/2021   CMP Latest Ref Rng & Units 05/03/2021  Glucose 70 - 99 mg/dL 84  BUN 6 - 20 mg/dL 7  Creatinine 0.44 - 1.00 mg/dL 0.59  Sodium 135 - 145 mmol/L 134(L)  Potassium 3.5 - 5.1 mmol/L 4.5  Chloride 98 - 111 mmol/L 106  CO2 22 - 32 mmol/L 22  Calcium 8.9 - 10.3 mg/dL 8.6(L)  Total Protein 6.5 - 8.1 g/dL 5.7(L)  Total Bilirubin 0.3 - 1.2 mg/dL 0.6  Alkaline Phos 38 - 126 U/L 123  AST 15 - 41 U/L 15  ALT 0 - 44 U/L 8   Edinburgh Score: Edinburgh Postnatal Depression Scale Screening Tool 05/05/2021  I have been able to laugh and see the funny side of things. 0  I have looked forward with enjoyment to things. 0  I have blamed myself unnecessarily when things went wrong. 0  I have been anxious or worried for no good reason. 0  I have felt scared or panicky  for no good reason. 0  Things have been getting on top of me. 0  I have been so unhappy that I have had difficulty sleeping. 0  I have felt sad or miserable. 0  I have been so unhappy that I have been crying. 0  The thought of harming myself has occurred to me. 0  Edinburgh Postnatal Depression Scale Total 0     After visit meds:  Allergies as of 05/05/2021       Reactions   Codeine Nausea And Vomiting        Medication List     STOP taking these medications    Accu-Chek Guide Me w/Device Kit   Accu-Chek Guide test strip Generic drug: glucose blood   Accu-Chek Softclix Lancets lancets   Tylenol 325 MG Caps Generic drug: Acetaminophen Replaced by: acetaminophen 325 MG tablet       TAKE these medications    acetaminophen 325 MG  tablet Commonly known as: Tylenol Take 2 tablets (650 mg total) by mouth every 4 (four) hours as needed (for pain scale < 4). Replaces: Tylenol 325 MG Caps   benzocaine-Menthol 20-0.5 % Aero Commonly known as: DERMOPLAST Apply 1 application topically as needed for irritation (perineal discomfort).   coconut oil Oil Apply 1 application topically as needed.   ferrous sulfate 325 (65 FE) MG tablet Take 1 tablet (325 mg total) by mouth every other day.   ibuprofen 600 MG tablet Commonly known as: ADVIL Take 1 tablet (600 mg total) by mouth every 6 (six) hours.   PRENATAL PO Take by mouth.   senna-docusate 8.6-50 MG tablet Commonly known as: Senokot-S Take 2 tablets by mouth daily.         Discharge home in stable condition Infant Feeding: Breast Infant Disposition:home with mother Discharge instruction: per After Visit Summary and Postpartum booklet. Activity: Advance as tolerated. Pelvic rest for 6 weeks.  Diet: carb modified diet Hgb 9.9-Oral Iron   Future Appointments: Future Appointments  Date Time Provider [place]  05/11/2021 10:10 AM [facility] NURSE [facility]  06/08/2021 10:30 AM Roma Schanz, CNM [facility]   Follow up Visit:   Please schedule this patient for a In person postpartum visit in 4 weeks with the following provider: Any provider. Additional Postpartum F/U:2 hour GTT  High risk pregnancy complicated by: GDM and TOLAC Delivery mode:  Vaginal, Spontaneous  Anticipated Birth Control:  Unsure   05/05/2021 Erskine Emery, MD  GME ATTESTATION:  I saw and evaluated the patient. I agree with the findings and the plan of care as documented in the resident's note.  Arrie Senate, MD OB Fellow, [place] for [place] 05/05/2021 10:40 AM

## 2021-05-04 ENCOUNTER — Encounter: Payer: 59 | Admitting: Obstetrics & Gynecology

## 2021-05-04 ENCOUNTER — Encounter (HOSPITAL_COMMUNITY): Payer: Self-pay | Admitting: Obstetrics and Gynecology

## 2021-05-04 DIAGNOSIS — O24410 Gestational diabetes mellitus in pregnancy, diet controlled: Secondary | ICD-10-CM | POA: Diagnosis present

## 2021-05-04 LAB — CBC
HCT: 30.5 % — ABNORMAL LOW (ref 36.0–46.0)
Hemoglobin: 9.9 g/dL — ABNORMAL LOW (ref 12.0–15.0)
MCH: 29.6 pg (ref 26.0–34.0)
MCHC: 32.5 g/dL (ref 30.0–36.0)
MCV: 91.3 fL (ref 80.0–100.0)
Platelets: 333 10*3/uL (ref 150–400)
RBC: 3.34 MIL/uL — ABNORMAL LOW (ref 3.87–5.11)
RDW: 13.9 % (ref 11.5–15.5)
WBC: 18.9 10*3/uL — ABNORMAL HIGH (ref 4.0–10.5)
nRBC: 0 % (ref 0.0–0.2)

## 2021-05-04 LAB — GLUCOSE, CAPILLARY
Glucose-Capillary: 145 mg/dL — ABNORMAL HIGH (ref 70–99)
Glucose-Capillary: 107 mg/dL — ABNORMAL HIGH (ref 70–99)

## 2021-05-04 MED ORDER — SENNOSIDES-DOCUSATE SODIUM 8.6-50 MG PO TABS
2.0000 | ORAL_TABLET | Freq: Every day | ORAL | Status: DC
  Administered 2021-05-04: 2 via ORAL
  Filled 2021-05-04: qty 2

## 2021-05-04 MED ORDER — IBUPROFEN 600 MG PO TABS
600.0000 mg | ORAL_TABLET | Freq: Four times a day (QID) | ORAL | Status: DC
Start: 2021-05-04 — End: 2021-05-05
  Administered 2021-05-04 – 2021-05-05 (×5): 600 mg via ORAL
  Filled 2021-05-04 (×5): qty 1

## 2021-05-04 MED ORDER — OXYTOCIN-SODIUM CHLORIDE 30-0.9 UT/500ML-% IV SOLN: 2.5000 [IU]/h | INTRAVENOUS | Status: DC

## 2021-05-04 MED ORDER — TETANUS-DIPHTH-ACELL PERTUSSIS 5-2.5-18.5 LF-MCG/0.5 IM SUSY: 0.5000 mL | PREFILLED_SYRINGE | Freq: Once | INTRAMUSCULAR | Status: DC

## 2021-05-04 MED ORDER — DIBUCAINE (PERIANAL) 1 % EX OINT: 1.0000 "application " | TOPICAL_OINTMENT | CUTANEOUS | Status: DC | PRN

## 2021-05-04 MED ORDER — ONDANSETRON HCL 4 MG/2ML IJ SOLN: 4.0000 mg | INTRAMUSCULAR | Status: DC | PRN

## 2021-05-04 MED ORDER — FLEET ENEMA 7-19 GM/118ML RE ENEM: 1.0000 | ENEMA | RECTAL | Status: DC | PRN

## 2021-05-04 MED ORDER — ZOLPIDEM TARTRATE 5 MG PO TABS: 5.0000 mg | ORAL_TABLET | Freq: Every evening | ORAL | Status: DC | PRN

## 2021-05-04 MED ORDER — TERBUTALINE SULFATE 1 MG/ML IJ SOLN: 0.2500 mg | Freq: Once | INTRAMUSCULAR | Status: DC | PRN

## 2021-05-04 MED ORDER — LIDOCAINE HCL (PF) 1 % IJ SOLN: 30.0000 mL | INTRAMUSCULAR | Status: DC | PRN

## 2021-05-04 MED ORDER — SIMETHICONE 80 MG PO CHEW: 80.0000 mg | CHEWABLE_TABLET | ORAL | Status: DC | PRN

## 2021-05-04 MED ORDER — BENZOCAINE-MENTHOL 20-0.5 % EX AERO: 1.0000 "application " | INHALATION_SPRAY | CUTANEOUS | Status: DC | PRN

## 2021-05-04 MED ORDER — COCONUT OIL OIL
1.0000 "application " | TOPICAL_OIL | Status: DC | PRN
  Administered 2021-05-04: 1 via TOPICAL

## 2021-05-04 MED ORDER — SOD CITRATE-CITRIC ACID 500-334 MG/5ML PO SOLN: 30.0000 mL | ORAL | Status: DC | PRN

## 2021-05-04 MED ORDER — LACTATED RINGERS IV SOLN: 500.0000 mL | INTRAVENOUS | Status: DC | PRN

## 2021-05-04 MED ORDER — LACTATED RINGERS IV SOLN: INTRAVENOUS | Status: DC

## 2021-05-04 MED ORDER — ONDANSETRON HCL 4 MG/2ML IJ SOLN: 4.0000 mg | Freq: Four times a day (QID) | INTRAMUSCULAR | Status: DC | PRN

## 2021-05-04 MED ORDER — OXYTOCIN BOLUS FROM INFUSION: 333.0000 mL | Freq: Once | INTRAVENOUS | Status: DC

## 2021-05-04 MED ORDER — FENTANYL CITRATE (PF) 100 MCG/2ML IJ SOLN: 100.0000 ug | INTRAMUSCULAR | Status: DC | PRN

## 2021-05-04 MED ORDER — ACETAMINOPHEN 325 MG PO TABS
650.0000 mg | ORAL_TABLET | ORAL | Status: DC | PRN
  Administered 2021-05-04: 650 mg via ORAL
  Filled 2021-05-04: qty 2

## 2021-05-04 MED ORDER — WITCH HAZEL-GLYCERIN EX PADS: 1.0000 "application " | MEDICATED_PAD | CUTANEOUS | Status: DC | PRN

## 2021-05-04 MED ORDER — ONDANSETRON HCL 4 MG PO TABS: 4.0000 mg | ORAL_TABLET | ORAL | Status: DC | PRN

## 2021-05-04 MED ORDER — DIPHENHYDRAMINE HCL 25 MG PO CAPS: 25.0000 mg | ORAL_CAPSULE | Freq: Four times a day (QID) | ORAL | Status: DC | PRN

## 2021-05-04 MED ORDER — PRENATAL MULTIVITAMIN CH
1.0000 | ORAL_TABLET | Freq: Every day | ORAL | Status: DC
Start: 2021-05-04 — End: 2021-05-05
  Administered 2021-05-04: 1 via ORAL
  Filled 2021-05-04: qty 1

## 2021-05-04 MED ORDER — ACETAMINOPHEN 325 MG PO TABS: 650.0000 mg | ORAL_TABLET | ORAL | Status: DC | PRN

## 2021-05-04 MED ORDER — HYDROXYZINE HCL 25 MG PO TABS: 50.0000 mg | ORAL_TABLET | Freq: Four times a day (QID) | ORAL | Status: DC | PRN

## 2021-05-04 MED ORDER — SODIUM CHLORIDE 0.9 % IV SOLN: 5.0000 10*6.[IU] | Freq: Once | INTRAVENOUS | Status: DC

## 2021-05-04 MED ORDER — PENICILLIN G POT IN DEXTROSE 60000 UNIT/ML IV SOLN: 3.0000 10*6.[IU] | INTRAVENOUS | Status: DC

## 2021-05-04 NOTE — Progress Notes (Signed)
POSTPARTUM PROGRESS NOTE  Subjective: Paula Houston is a 25 y.o. [id number] PPD#1 VBAC. Pt reports that she has been having frontal HAs intermittently throughout the day in addition to shakiness and weakness. Denies any excessive VB and reports that she has not been drinking much water. Denies nausea or vomiting. Ibuprofen has been helping with her HAs.   Objective: Blood pressure (!) 92/59, pulse 79, temperature 98 F (36.7 C), temperature source Axillary, resp. rate 18, height '5\' 6"'$  (1.676 m), weight 90.3 kg, last menstrual period 07/31/2020, SpO2 99 %, unknown if currently breastfeeding.  Physical Exam:  General: alert, cooperative and no distress Chest: no respiratory distress Abdomen: soft, non-tender  Uterine Fundus: firm and at level of umbilicus Extremities: No calf swelling or tenderness  No edema  Recent Labs    05/03/21 1204 05/04/21 0358  HGB 11.0* 9.9*  HCT 33.1* 30.5*    Assessment/Plan: Paula Houston is a 25 y.o. [id number] s/p PPD#1 VBAC.  -- Hgb 9.9- will send home on oral iron  -- Recheck CBG for pt given symptoms  -- Discussed the need for hydration and eating well during this time.  -- If she starts to feel these symptoms again, she was advised to let us know  Dispo: Plan for discharge tomorrow.  Erskine Emery, [place] for [place] 05/04/2021 7:01 PM

## 2021-05-04 NOTE — Lactation Note (Signed)
This note was copied from a baby's chart. Lactation Consultation Note  Patient Name: Paula Houston [id number] Date: 05/04/2021 Reason for consult: Initial assessment;Term Age:25 hours  LC in to visit with P3 Mom of term baby.  Mom states that breastfeeding has been going well and that baby latches well.  If it's uncomfortable, she takes him off and relatches him deeply and sometimes pulls down on chin to open his mouth wider.  Baby being circumcised currently.  Baby has been latching frequently.  Encouraged continued STS and offering breast with cues. Recommended she ask for assistance prn and for her RN to observe baby at the breast.  Mom has a poor breastfeeding history with concerns about her milk supply and need to supplement.  Mom recalls her milk volume increasing with her 2nd child, but pumping made her nipples sore and she eventually stopped breastfeeding.  Mom has no desire to pump which is what this LC would recommend..  Talked about the concern about her milk supply history.  Mom reports + breast changes with this pregnancy. Mom also states that this baby is latching and feeding much better than her other two.  Lactation brochure provided.  Mom aware of IP and OP lactation support available to her.   Interventions Interventions: Breast feeding basics reviewed;Skin to skin;Breast massage;Hand express;Education   Consult Status Consult Status: Follow-up Date: 05/05/21 Follow-up type: In-patient    Broadus John 05/04/2021, 3:33 PM

## 2021-05-04 NOTE — Progress Notes (Deleted)
POSTPARTUM PROGRESS NOTE  Subjective: Paula Houston is a 25 y.o. [id number] PPD#1 VBAC. Pt reports that she has been having frontal HAs intermittently throughout the day in addition to shakiness and weakness. Denies any excessive VB and reports that she has not been drinking much water. Denies nausea or vomiting. Ibuprofen has been helping with her HAs.   Objective: Blood pressure (!) 92/59, pulse 79, temperature 98 F (36.7 C), temperature source Axillary, resp. rate 18, height '5\' 6"'$  (1.676 m), weight 90.3 kg, last menstrual period 07/31/2020, SpO2 99 %, unknown if currently breastfeeding.  Physical Exam:  General: alert, cooperative and no distress Chest: no respiratory distress Abdomen: soft, non-tender  Uterine Fundus: firm and at level of umbilicus Extremities: No calf swelling or tenderness  No edema  Recent Labs    05/03/21 1204 05/04/21 0358  HGB 11.0* 9.9*  HCT 33.1* 30.5*    Assessment/Plan: Paula Houston is a 25 y.o. [id number] s/p PPD#1 VBAC.  -- Hgb 9.9- will send home on oral iron  -- Recheck CBG for pt given symptoms  -- Discussed the need for hydration and eating well during this time.  -- If she starts to feel these symptoms again, she was advised to let us know  Dispo: Plan for discharge tomorrow.  Erskine Emery, [place] for [place] 05/04/2021 7:01 PM

## 2021-05-04 NOTE — Anesthesia Postprocedure Evaluation (Signed)
Anesthesia Post Note  Patient: Paula Houston  Procedure(s) Performed: AN AD HOC LABOR EPIDURAL     Patient location during evaluation: Mother Baby Anesthesia Type: Epidural Level of consciousness: awake Pain management: satisfactory to patient Vital Signs Assessment: post-procedure vital signs reviewed and stable Respiratory status: spontaneous breathing Cardiovascular status: stable Anesthetic complications: no   No notable events documented.  Last Vitals:  Vitals:   05/04/21 1324 05/04/21 1452  BP: (!) 97/58 115/73  Pulse: 62 73  Resp: 17 17  Temp: 36.7 C 36.9 C  SpO2: 98% 99%    Last Pain:  Vitals:   05/04/21 1452  TempSrc: Axillary  PainSc:    Pain Goal: Patients Stated Pain Goal: 2 (05/03/21 2159)                 Casimer Lanius

## 2021-05-04 NOTE — Progress Notes (Signed)
Circumcision Consent  Discussed with mom at bedside about circumcision.   Circumcision is a surgery that removes the skin that covers the tip of the penis, called the "foreskin." Circumcision is usually done when a boy is between 2 and 58 days old, sometimes up to 83-40 weeks old.  The most common reasons boys are circumcised include for cultural/religious beliefs or for parental preference (potentially easier to clean, so baby looks like daddy, etc).  There may be some medical benefits for circumcision:   Circumcised boys seem to have slightly lower rates of: ? Urinary tract infections (per the American Academy of Pediatrics an uncircumcised boy has a 1/100 chance of developing a UTI in the first year of life, a circumcised boy at a 10/998 chance of developing a UTI in the first year of life- a 10% reduction) ? Penis cancer (typically rare- an uncircumcised female has a 1 in 100,000 chance of developing cancer of the penis) ? Sexually transmitted infection (in endemic areas, including HIV, HPV and Herpes- circumcision does NOT protect against gonorrhea, chlamydia, trachomatis, or syphilis) ? Phimosis: a condition where that makes retraction of the foreskin over the glans impossible (0.4 per 1000 boys per year or 0.6% of boys are affected by their 15th birthday)  Boys and men who are not circumcised can reduce these extra risks by: ? Cleaning their penis well ? Using condoms during sex  What are the risks of circumcision?  As with any surgical procedure, there are risks and complications. In circumcision, complications are rare and usually minor, the most common being: ? Bleeding- risk is reduced by holding each clamp for 30 seconds prior to a cut being made, and by holding pressure after the procedure is done ? Infection- the penis is cleaned prior to the procedure, and the procedure is done under sterile technique ? Damage to the urethra or amputation of the penis  How is circumcision done  in baby boys?  The baby will be placed on a special table and the legs restrained for their safety. Numbing medication is injected into the penis, and the skin is cleansed with betadine to decrease the risk of infection.   What to expect:  The penis will look red and raw for 5-7 days as it heals. We expect scabbing around where the cut was made, as well as clear-pink fluid and some swelling of the penis right after the procedure. If your baby's circumcision starts to bleed or develops pus, please contact your pediatrician immediately.  All questions were answered and mother consented.  Patriciaann Clan Obstetrics Fellow

## 2021-05-04 NOTE — Progress Notes (Addendum)
POSTPARTUM PROGRESS NOTE  Subjective: Paula Houston is a 25 y.o. [id number] #PPD1 VBAC.  She reports she doing well. No acute events overnight. She denies any problems with ambulating, voiding or po intake. Denies nausea or vomiting. She has passed flatus. Pain is well controlled.  Lochia is appropriate.  Objective: Blood pressure 104/67, pulse 88, temperature 98.4 F (36.9 C), temperature source Oral, resp. rate 18, height '5\' 6"'$  (1.676 m), weight 90.3 kg, last menstrual period 07/31/2020, SpO2 99 %, unknown if currently breastfeeding.  Physical Exam:  General: alert, cooperative and no distress Chest: no respiratory distress Abdomen: soft, non-tender  Uterine Fundus: firm and at level of umbilicus Extremities: No calf swelling or tenderness  No edema  Recent Labs    05/03/21 1204 05/04/21 0358  HGB 11.0* 9.9*  HCT 33.1* 30.5*    Assessment/Plan: Paula Houston is a 25 y.o. [id number] #PPD1 VBAC at [redacted]w[redacted]d  Routine Postpartum Care: Doing well, pain well-controlled.  -- Continue routine care, lactation support  -- Contraception: unsure-will discuss further with pt -- Feeding: breast -- last CBG 145-per nursing, had crackers and soda prior to getting CBG taken. All previous CBG wnl -- Hgb 9.9- Asymptomatic. Send home on PO iron  Dispo: Plan for discharge tomorrow.  Paula Emery [place]7/25/2022 8:24 AM  GME ATTESTATION:  I saw and evaluated the patient. I agree with the findings and the plan of care as documented in the resident's note.  Paula Hillock DO OB Fellow, [place]7/25/2022 10:13 AM

## 2021-05-04 NOTE — Social Work (Signed)
CSW received consult for hx of Anxiety. CSW met with MOB to offer support and complete assessment.    CSW met with MOB at bedside. CSW observed MOB breastfeeding the infant. FOB present at bedside. CSW congratulated MOB and introduced CSW role. CSW presented calm and welcoming of CSW visit. CSW offered MOB privacy. She preferred that FOB stay during the assessment. CSW inquired how MOB has felt since giving birth. MOB expressed feeling good but tired. CSW inquired how MOB felt emotionally during the pregnancy. MOB disclosed during the first trimester she felt very anxious. MOB reported her "anxiety was high" when she felt stress and overwhelmed. MOB acknowledged she has a history of anxiety. MOB reported to cope with her anxiety symptoms she utilized breathing exercises and felt her mood improved during the second and third trimester. MOB reported that she intentionally takes time to sit down and have a moment to herself to regroup when feeling anxious. CSW praised MOB for her coping mechanisms. CSW inquired if MOB experienced postpartum depression. MOB disclosed she experienced postpartum in 2012 after the birth of her son. MOB reported feeling sad, and alone because her spouse was working out of town at the time. MOB reported this time she will have support from her spouse and mother. MOB reports no history of therapy or medication use. MOB reported she is currently managing her anxiety and is not interested in therapy or medication at this time. CSW provided education regarding the baby blues period vs. perinatal mood disorders, discussed treatment and gave resources for mental health follow up. CSW recommended MOB complete a self-evaluation during the postpartum time period using the New Mom Checklist from Postpartum Progress and encouraged MOB to contact a medical professional if symptoms are noted at any time. MOB reported she feels comfortable reaching out to her physician, if concerns arise. CSW assessed  MOB for safety. MOB denied thoughts of harm to self and others.   CSW provided review of Sudden Infant Death Syndrome (SIDS) precautions. MOB reported the infant will sleep in a bassinet. MOB reported she has essential items for the infant. MOB has chosen [facility] for infant's follow up car. CSW assessed MOB for additional needs. MOB reported no further need.     CSW identifies no further need for intervention and no barriers to discharge at this time.   Nicole Sinclair, MSW, LCSW [facility]  Clinical Social Worker  [phone] 05/04/2021  12:09 PM 

## 2021-05-04 NOTE — Progress Notes (Addendum)
Patient's fasting blood sugar of 145 taken @ 0542 was not a true fasting. Patient ate graham crackers and a coke at approximately 0345 prior to the sugar reading being taken.

## 2021-05-05 ENCOUNTER — Other Ambulatory Visit (HOSPITAL_COMMUNITY): Payer: 59

## 2021-05-05 MED ORDER — SENNOSIDES-DOCUSATE SODIUM 8.6-50 MG PO TABS: 2.0000 | ORAL_TABLET | Freq: Every day | ORAL | Status: DC

## 2021-05-05 MED ORDER — BENZOCAINE-MENTHOL 20-0.5 % EX AERO: 1.0000 "application " | INHALATION_SPRAY | CUTANEOUS | Status: DC | PRN

## 2021-05-05 MED ORDER — COCONUT OIL OIL: 1.0000 "application " | TOPICAL_OIL | 0 refills | Status: DC | PRN

## 2021-05-05 MED ORDER — IBUPROFEN 600 MG PO TABS: 600.0000 mg | ORAL_TABLET | Freq: Four times a day (QID) | ORAL | 0 refills | Status: DC

## 2021-05-05 MED ORDER — FERROUS SULFATE 325 (65 FE) MG PO TABS: 325.0000 mg | ORAL_TABLET | ORAL | 3 refills | Status: DC

## 2021-05-05 MED ORDER — ACETAMINOPHEN 325 MG PO TABS: 650.0000 mg | ORAL_TABLET | ORAL | Status: DC | PRN

## 2021-05-07 ENCOUNTER — Inpatient Hospital Stay (HOSPITAL_COMMUNITY): Payer: 59

## 2021-05-07 ENCOUNTER — Inpatient Hospital Stay (HOSPITAL_COMMUNITY): Admission: AD | Admit: 2021-05-07 | Payer: 59 | Source: Home / Self Care | Admitting: Obstetrics & Gynecology

## 2021-05-11 ENCOUNTER — Other Ambulatory Visit: Payer: Self-pay

## 2021-05-11 ENCOUNTER — Telehealth (INDEPENDENT_AMBULATORY_CARE_PROVIDER_SITE_OTHER): Payer: 59

## 2021-05-11 ENCOUNTER — Telehealth: Payer: 59

## 2021-05-11 VITALS — BP 121/81 | HR 73 | Wt 180.0 lb

## 2021-05-11 DIAGNOSIS — O0993 Supervision of high risk pregnancy, unspecified, third trimester: Secondary | ICD-10-CM

## 2021-05-11 DIAGNOSIS — Z0130 Encounter for examination of blood pressure without abnormal findings: Secondary | ICD-10-CM

## 2021-05-11 NOTE — Progress Notes (Addendum)
I connected with  [name] on 05/11/21 via telephone and verified that I am speaking with the correct person using two identifiers.   I discussed the limitations of evaluation and management by telemedicine. The patient expressed understanding and agreed to proceed.  Patient: At home Provider: Office    NURSE VISIT- BLOOD PRESSURE CHECK  SUBJECTIVE:  Paula Houston is a 25 y.o. G3P3003 female here for BP check. She is postpartum, delivery date 05/03/21     HYPERTENSION ROS:  Postpartum:  Severe headaches that don't go away with tylenol/other medicines: No  Visual changes (seeing spots/double/blurred vision) No  Severe pain under right breast breast or in center of upper chest No  Severe nausea/vomiting No  Taking medicines as instructed not applicable   OBJECTIVE:  BP 121/81 (BP Location: Left Arm, Patient Position: Sitting, Cuff Size: Normal)   Pulse 73   Wt 180 lb (81.6 kg)   LMP 07/31/2020   Breastfeeding Yes Comment: Supplementing with formula  BMI 29.05 kg/m   Appearance alert, well appearing, and in no distress and oriented to person, place, and time.  ASSESSMENT: Postpartum  blood pressure check  PLAN: Discussed with Knute Neu, CNM, [facility]   Recommendations: no changes needed   Follow-up: as scheduled   Sameena Artus A Lilja Soland  05/11/2021 10:17 AM   Chart reviewed for nurse visit. Agree with plan of care.  Roma Schanz, [place] 05/11/2021 12:32 PM

## 2021-05-14 ENCOUNTER — Telehealth (HOSPITAL_COMMUNITY): Payer: Self-pay | Admitting: *Deleted

## 2021-05-14 NOTE — Telephone Encounter (Signed)
Attempted Hospital Discharge Follow-Up Call.  Left voice mail message requesting that patient return RN's phone call.

## 2021-06-08 ENCOUNTER — Ambulatory Visit: Payer: 59 | Admitting: Women's Health

## 2021-06-10 ENCOUNTER — Ambulatory Visit (INDEPENDENT_AMBULATORY_CARE_PROVIDER_SITE_OTHER): Payer: 59 | Admitting: Advanced Practice Midwife

## 2021-06-10 ENCOUNTER — Other Ambulatory Visit: Payer: Self-pay

## 2021-06-10 ENCOUNTER — Encounter: Payer: Self-pay | Admitting: Advanced Practice Midwife

## 2021-06-10 ENCOUNTER — Ambulatory Visit: Payer: 59 | Admitting: Advanced Practice Midwife

## 2021-06-10 DIAGNOSIS — Z8632 Personal history of gestational diabetes: Secondary | ICD-10-CM | POA: Diagnosis not present

## 2021-06-10 MED ORDER — ETONOGESTREL-ETHINYL ESTRADIOL 0.12-0.015 MG/24HR VA RING: VAGINAL_RING | VAGINAL | 12 refills | Status: DC

## 2021-06-10 NOTE — Progress Notes (Signed)
POSTPARTUM VISIT Patient name: Paula Houston MRN [id number]  Date of birth: [birth year] Chief Complaint:   Postpartum Care  History of Present Illness:   LOTUS Houston is a 25 y.o. [id number] Caucasian female being seen today for a postpartum visit. She is 5 weeks postpartum following a vaginal birth after cesarean (VBAC) at 39.3 gestational weeks. IOL: no  Anesthesia: epidural.  Laceration: 2nd degree.  Complications: suspected placental abruption noted at delivery with placental adherent clot. Inpatient contraception: no.   Pregnancy complicated by GDMA1, prev C/S . Tobacco use: no. Substance use disorder: no. Last pap smear: Jan 2022 and results were NILM w/ HRHPV not done. Next pap smear due: Jan 2025 No LMP recorded.  Postpartum course has been uncomplicated. Bleeding none. Bowel function is normal. Bladder function is normal. Urinary incontinence? no, fecal incontinence? no Patient is sexually active. Last sexual activity:  Aug 30th . Desired contraception:  Nuvaring . Patient does not know about a pregnancy in the future.  Desired family size is unsure number of children.   Upstream - 06/10/21 1601       Pregnancy Intention Screening   Does the patient want to become pregnant in the next year? No    Does the patient's partner want to become pregnant in the next year? No    Would the patient like to discuss contraceptive options today? No      Contraception Wrap Up   Current Method No Contraceptive Precautions    Contraception Counseling Provided Yes            The pregnancy intention screening data noted above was reviewed. Potential methods of contraception were discussed. The patient elected to proceed with No data recorded.  Edinburgh Postpartum Depression Screening: negative  Edinburgh Postnatal Depression Scale - 06/10/21 1627       Edinburgh Postnatal Depression Scale:  In the Past 7 Days   I have been able to laugh and see the funny side of things. 0    I  have looked forward with enjoyment to things. 0    I have blamed myself unnecessarily when things went wrong. 0    I have been anxious or worried for no good reason. 0    I have felt scared or panicky for no good reason. 0    Things have been getting on top of me. 0    I have been so unhappy that I have had difficulty sleeping. 0    I have felt sad or miserable. 0    I have been so unhappy that I have been crying. 0    The thought of harming myself has occurred to me. 0    Edinburgh Postnatal Depression Scale Total 0             GAD 7 : Generalized Anxiety Score 02/11/2021 10/29/2020  Nervous, Anxious, on Edge 0 1  Control/stop worrying 0 0  Worry too much - different things 0 0  Trouble relaxing 0 0  Restless 0 0  Easily annoyed or irritable 0 1  Afraid - awful might happen 0 0  Total GAD 7 Score 0 2     Baby's course has been complicated by getting formula situated due to colic . Baby is feeding by bottle. Infant has a pediatrician/family doctor? Yes.  Childcare strategy if returning to work/school: n/a-stay at home mom.  Pt has material needs met for her and baby: Yes.   Review of Systems:   Pertinent items are  noted in HPI Denies Abnormal vaginal discharge w/ itching/odor/irritation, headaches, visual changes, shortness of breath, chest pain, abdominal pain, severe nausea/vomiting, or problems with urination or bowel movements. Pertinent History Reviewed:  Reviewed past medical,surgical, obstetrical and family history.  Reviewed problem list, medications and allergies. OB History  Gravida Para Term Preterm AB Living  _0 0 0 3  SAB IAB Ectopic Multiple Live Births  0 0 0 0 3    # Outcome Date GA Lbr Len/2nd Weight Sex Delivery Anes PTL Lv  3 Term 05/03/21 63w3d07:28 / 02:17 7 lb 13 oz (3.544 kg) M Vag-Spont EPI  LIV  2 Term 12/25/16 315w6d7 lb 8.8 oz (3.425 kg) F CS-LTranv Spinal  LIV     Complications: Breech presentation  1 Term 12/29/10 3955w0d lb 14 oz (2.665  kg) M Vag-Spont EPI N LIV     Birth Comments: System Generated. Please review and update pregnancy details.   Physical Assessment:   Vitals:   06/10/21 1600  BP: 121/83  Pulse: 82  Weight: 184 lb (83.5 kg)  Body mass index is 29.7 kg/m.       Physical Examination:   General appearance: alert, well appearing, and in no distress  Mental status: alert, oriented to person, place, and time  Skin: warm & dry   Cardiovascular: normal heart rate noted   Respiratory: normal respiratory effort, no distress   Breasts: deferred, no complaints   Abdomen: soft, non-tender   Pelvic: examination not indicated. Thin prep pap obtained: No  Rectal: not examined  Extremities: Edema: none         No results found for this or any previous visit (from the past 24 hour(s)).  Assessment & Plan:  1) Postpartum exam 2) Five wks s/p vaginal birth after cesarean (VBAC) 3) bottle feeding 4) Depression screening 5) Contraception management: will abstain from sex x 2wks then take UPT prior to starting NuvaRing 6) s/p GDMA1, will schedule 2hr GTT  Essential components of care per ACOG recommendations:  1.  Mood and well being:  If positive depression screen, discussed and plan developed.  If using tobacco we discussed reduction/cessation and risk of relapse If current substance abuse, we discussed and referral to local resources was offered.   2. Infant care and feeding:  If breastfeeding, discussed returning to work, pumping, breastfeeding-associated pain, guidance regarding return to fertility while lactating if not using another method. If needed, patient was provided with a letter to be allowed to pump q 2-3hrs to support lactation in a private location with access to a refrigerator to store breastmilk.   Recommended that all caregivers be immunized for flu, pertussis and other preventable communicable diseases If pt does not have material needs met for her/baby, referred to local resources for help  obtaining these.  3. Sexuality, contraception and birth spacing Provided guidance regarding sexuality, management of dyspareunia, and resumption of intercourse Discussed avoiding interpregnancy interval <6mt56mand recommended birth spacing of 18 months  4. Sleep and fatigue Discussed coping options for fatigue and sleep disruption Encouraged family/partner/community support of 4 hrs of uninterrupted sleep to help with mood and fatigue  5. Physical recovery  If pt had a C/S, assessed incisional pain and providing guidance on normal vs prolonged recovery If pt had a laceration, perineal healing and pain reviewed.  If urinary or fecal incontinence, discussed management and referred to PT or uro/gyn if indicated  Patient is safe to resume physical activity. Discussed attainment of  healthy weight.  6.  Chronic disease management Discussed pregnancy complications if any, and their implications for future childbearing and long-term maternal health. Review recommendations for prevention of recurrent pregnancy complications, such as 17 hydroxyprogesterone caproate to reduce risk for recurrent PTB not applicable, or aspirin to reduce risk of preeclampsia not applicable. Pt had GDM: yes. If yes, 2hr GTT scheduled: yes. Reviewed medications and non-pregnant dosing including consideration of whether pt is breastfeeding using a reliable resource such as LactMed: not applicable Referred for f/u w/ PCP or subspecialist providers as indicated: has PCP  7. Health maintenance Mammogram at 25yo or earlier if indicated Pap smears as indicated  Meds:  Meds ordered this encounter  Medications   etonogestrel-ethinyl estradiol (NUVARING) 0.12-0.015 MG/24HR vaginal ring    Sig: Insert vaginally and leave in place for 3 consecutive weeks, then remove for 1 week.    Dispense:  1 each    Refill:  12    Order Specific Question:   Supervising Provider    Answer:   Florian Buff [[id number]]     Follow-up: Return  in about 1 year (around 06/10/2022) for Physical; Needs 2hr GTT in next 1-3 weeks.   Orders Placed This Encounter  Procedures   Glucose Tolerance, 2 Hours w/1 Hour     Myrtis Ser [facility] 06/10/2021 4:28 PM

## 2021-06-19 ENCOUNTER — Other Ambulatory Visit: Payer: 59

## 2021-06-20 LAB — GLUCOSE TOLERANCE, 2 HOURS W/ 1HR
Glucose, 1 hour: 177 mg/dL (ref 65–179)
Glucose, 2 hour: 101 mg/dL (ref 65–152)
Glucose, Fasting: 99 mg/dL — ABNORMAL HIGH (ref 65–91)

## 2021-06-22 ENCOUNTER — Telehealth: Payer: Self-pay | Admitting: Women's Health

## 2021-06-22 NOTE — Telephone Encounter (Signed)
Patient was calling about her lab results from 06/19/2021

## 2021-06-24 ENCOUNTER — Telehealth: Payer: Self-pay | Admitting: Family Medicine

## 2021-06-24 NOTE — Telephone Encounter (Signed)
Pt contacted. Pt schedule with Hoyle Sauer for Friday at 11

## 2021-06-24 NOTE — Telephone Encounter (Signed)
Patient called stating recent lab from [facility] her sugar levels were elevated and she was needing follow up labs were done on 9/9. Please advise

## 2021-06-24 NOTE — Telephone Encounter (Signed)
Please advise. Per result note from OBGYN; they recommend contacted [place] to make sure PCP is aware. Last seen Sept 2021.  OK to schedule with Hoyle Sauer? Please advise. Thank you

## 2021-06-26 ENCOUNTER — Other Ambulatory Visit: Payer: Self-pay

## 2021-06-26 ENCOUNTER — Ambulatory Visit (INDEPENDENT_AMBULATORY_CARE_PROVIDER_SITE_OTHER): Payer: 59 | Admitting: Nurse Practitioner

## 2021-06-26 VITALS — BP 117/75 | HR 60 | Wt 186.0 lb

## 2021-06-26 DIAGNOSIS — T3995XA Adverse effect of unspecified nonopioid analgesic, antipyretic and antirheumatic, initial encounter: Secondary | ICD-10-CM | POA: Diagnosis not present

## 2021-06-26 DIAGNOSIS — G4440 Drug-induced headache, not elsewhere classified, not intractable: Secondary | ICD-10-CM | POA: Diagnosis not present

## 2021-06-26 DIAGNOSIS — G43009 Migraine without aura, not intractable, without status migrainosus: Secondary | ICD-10-CM

## 2021-06-26 DIAGNOSIS — Z8632 Personal history of gestational diabetes: Secondary | ICD-10-CM | POA: Diagnosis not present

## 2021-06-26 MED ORDER — TOPIRAMATE 50 MG PO TABS: ORAL_TABLET | ORAL | 2 refills | Status: DC

## 2021-06-26 NOTE — Progress Notes (Signed)
   Subjective:    Patient ID: Paula Houston, female    DOB: 16-Feb-1996, 25 y.o.   MRN: [id number]  HPI  Patient states she had gestational diabetes and gave birth almost 8 weeks ago- she did a glucose test last week and her fasting number was still slightly elevated but her other numbers returned to normal and OB wanted her fo follow up with PCP Fasting sugar was 99(slightly elevated) and one hour and 2 hour results were normal  Review of Systems     Objective:   Physical Exam        Assessment & Plan:

## 2021-06-26 NOTE — Progress Notes (Signed)
Subjective:    Patient ID: Paula Houston, female    DOB: 1996/01/06, 25 y.o.   MRN: [id number]  HPI Patient presents for check-up regarding gestational diabetes. She is 6wks post partum and denies experiencing any symptoms of increased thirst, increased urination, or episodes of shakiness, dizziness, diaphoresis. Last Friday she had a 2-hr glucose tolerance test and was advised to follow-up with primary care provider. Trying to do well with her diet. Staying active with her children. Has done well losing her pregnancy weight.  Not breast feeding and uses Nuvaring for birth control. I s having headaches every other day with no nausea or vomiting or blurred vision. Describes as throbbing, sensitive to loud noises and light. Takes ibuprofen which relieves pain. No visual changes. No numbness or weakness of the face, arms or legs.  Depression screen [facility] 2/9 06/26/2021  Decreased Interest 0  Down, Depressed, Hopeless 0  PHQ - 2 Score 0  Altered sleeping -  Tired, decreased energy -  Change in appetite -  Feeling bad or failure about yourself  -  Trouble concentrating -  Moving slowly or fidgety/restless -  Suicidal thoughts -  PHQ-9 Score -     Review of Systems  Constitutional:  Negative for chills, diaphoresis, fatigue and fever.  HENT:  Negative for sore throat and trouble swallowing.   Respiratory:  Negative for cough, chest tightness and shortness of breath.   Cardiovascular:  Negative for chest pain.  Gastrointestinal:  Negative for nausea and vomiting.  Endocrine: Negative for polydipsia, polyphagia and polyuria.  Neurological:  Positive for headaches. Negative for syncope, weakness, light-headedness and numbness.      Objective:   Physical Exam Constitutional:      General: She is not in acute distress.    Appearance: Normal appearance.  Neck:     Comments: Thyroid non-tender, no nodules or masses noted.  Cardiovascular:     Rate and Rhythm: Normal rate and regular  rhythm.     Heart sounds: Normal heart sounds. No murmur heard. Pulmonary:     Effort: Pulmonary effort is normal.     Breath sounds: Normal breath sounds.  Musculoskeletal:     Cervical back: Neck supple.  Lymphadenopathy:     Cervical: No cervical adenopathy.  Skin:    General: Skin is warm.  Neurological:     Mental Status: She is alert and oriented to person, place, and time.  Psychiatric:        Mood and Affect: Mood normal.        Behavior: Behavior normal.        Thought Content: Thought content normal.        Judgment: Judgment normal.   .. Vitals:   06/26/21 1105  BP: 117/75  Pulse: 60  Weight: 186 lb (84.4 kg)   Results for orders placed or performed in visit on 06/10/21  Glucose Tolerance, 2 Hours w/1 Hour  Result Value Ref Range   Glucose, Fasting 99 (H) 65 - 91 mg/dL   Glucose, 1 hour 177 65 - 179 mg/dL   Glucose, 2 hour 101 65 - 152 mg/dL        Assessment & Plan:   Problem List Items Addressed This Visit       Cardiovascular and Mediastinum   Migraine without aura and without status migrainosus, not intractable - Primary   Relevant Medications   topiramate (TOPAMAX) 50 MG tablet     Other   Analgesic rebound headache   Relevant  Medications   topiramate (TOPAMAX) 50 MG tablet   History of gestational diabetes   Suspect probable analgesic induced headache due to regular use of Ibuprofen Meds ordered this encounter  Medications   topiramate (TOPAMAX) 50 MG tablet    Sig: Take 1/2 tab po qhs x 6 days then one po qhs for migraine prevention    Dispense:  30 tablet    Refill:  2    Order Specific Question:   Supervising Provider    Answer:   Sallee Lange A [[id number]]     Monitor blood sugars over next few months. No further intervention at this time. Discussed risk of developing T2DM during her lifetime due to gestational diabetes.  Monitor headaches with headache tracker app. Goal is to prevent headaches and limit use of analgesics. Warning  signs reviewed.   Start Topamax as directed. Reviewed potential side effects. Patient understands that medicine should not be taken during pregnancy due to potential birth defects.  Return in 1 month (on 07/26/2021) for virtual or phone visit is fine.

## 2021-06-28 ENCOUNTER — Encounter: Payer: Self-pay | Admitting: Nurse Practitioner

## 2021-06-28 DIAGNOSIS — G4440 Drug-induced headache, not elsewhere classified, not intractable: Secondary | ICD-10-CM | POA: Insufficient documentation

## 2021-08-18 ENCOUNTER — Other Ambulatory Visit: Payer: Self-pay

## 2021-08-18 ENCOUNTER — Ambulatory Visit (INDEPENDENT_AMBULATORY_CARE_PROVIDER_SITE_OTHER): Payer: 59 | Admitting: Family Medicine

## 2021-08-18 VITALS — HR 114 | Temp 98.4°F | Ht 66.0 in | Wt 182.0 lb

## 2021-08-18 DIAGNOSIS — J988 Other specified respiratory disorders: Secondary | ICD-10-CM | POA: Diagnosis not present

## 2021-08-18 MED ORDER — PROMETHAZINE-DM 6.25-15 MG/5ML PO SYRP: 5.0000 mL | ORAL_SOLUTION | Freq: Four times a day (QID) | ORAL | 0 refills | Status: DC | PRN

## 2021-08-18 MED ORDER — DOXYCYCLINE HYCLATE 100 MG PO CAPS: 100.0000 mg | ORAL_CAPSULE | Freq: Two times a day (BID) | ORAL | 0 refills | Status: DC

## 2021-08-18 NOTE — Patient Instructions (Signed)
Lots of fluids.  Rest.  Medications as prescribed.  Take care  Dr. Lacinda Axon

## 2021-08-18 NOTE — Assessment & Plan Note (Signed)
Patient's been sick for the past week and has had initial improvement with worsening again, i.e. double sickening.  Placing on doxycycline to cover for bacterial sinusitis and possible bronchitis.  Promethazine DM for cough.

## 2021-08-18 NOTE — Progress Notes (Signed)
Subjective:  Patient ID: Paula Houston, female    DOB: 11/22/1995  Age: 25 y.o. MRN: [id number]  CC: Chief Complaint  Patient presents with   Cough    Dry/ productive, congestion, runny nose x2 weeks, taking dayquil, alka seltzer - home covid- negative test  Last week had sore throat, laryngitis, low grade fevers    HPI:  25 year old female presents with respiratory symptoms.  Patient reports that she has been sick since last week around Monday.  She states that she has had sore throat, cough, congestion and also runny nose and sneezing.  She has had home COVID testing which has been negative.  No reported sick contacts.  Patient states that she initially improved but then worsened again over the past several days.  She states that her cough is mostly dry.  She continues to have congestion.  Cough is most troublesome symptom.  No documented fever.  She has taken over-the-counter DayQuil and Alka-Seltzer without relief.  No other complaints or concerns at this time.  Patient Active Problem List   Diagnosis Date Noted   Respiratory infection 08/18/2021   Analgesic rebound headache 06/28/2021   History of gestational diabetes 06/26/2021   Migraine without aura and without status migrainosus, not intractable 06/26/2021   PCOS (polycystic ovarian syndrome) 06/08/2016   Painful menstrual periods 06/04/2014    Social Hx   Social History   Socioeconomic History   Marital status: Married    Spouse name: Quillian Quince   Number of children: 3   Years of education: Not on file   Highest education level: Not on file  Occupational History   Not on file  Tobacco Use   Smoking status: Never   Smokeless tobacco: Never  Vaping Use   Vaping Use: Never used  Substance and Sexual Activity   Alcohol use: No   Drug use: No   Sexual activity: Yes    Birth control/protection: None  Other Topics Concern   Not on file  Social History Narrative   Not on file   Social Determinants of Health    Financial Resource Strain: Low Risk    Difficulty of Paying Living Expenses: Not hard at all  Food Insecurity: No Food Insecurity   Worried About Charity fundraiser in the Last Year: Never true   [place] in the Last Year: Never true  Transportation Needs: No Transportation Needs   Lack of Transportation (Medical): No   Lack of Transportation (Non-Medical): No  Physical Activity: Inactive   Days of Exercise per Week: 0 days   Minutes of Exercise per Session: 0 min  Stress: No Stress Concern Present   Feeling of Stress : Not at all  Social Connections: Moderately Isolated   Frequency of Communication with Friends and Family: More than three times a week   Frequency of Social Gatherings with Friends and Family: Once a week   Attends Religious Services: Never   Printmaker: No   Attends Music therapist: Never   Marital Status: Married    Review of Systems Per HPI  Objective:  Pulse (!) 114   Temp 98.4 F (36.9 C)   Ht 5\' 6"  (1.676 m)   Wt 182 lb (82.6 kg)   SpO2 99%   BMI 29.38 kg/m   BP/Weight 08/18/2021 06/26/2021 2/63/7858  Systolic BP - 850 277  Diastolic BP - 75 83  Wt. (Lbs) 182 186 184  BMI 29.38 30.02 29.7  Physical Exam Constitutional:      General: She is not in acute distress.    Appearance: Normal appearance. She is not ill-appearing.  HENT:     Head: Normocephalic and atraumatic.     Right Ear: Tympanic membrane normal.     Left Ear: Tympanic membrane normal.     Nose: Congestion present.     Mouth/Throat:     Pharynx: Oropharynx is clear. Posterior oropharyngeal erythema present.  Eyes:     General:        Right eye: No discharge.        Left eye: No discharge.     Conjunctiva/sclera: Conjunctivae normal.  Cardiovascular:     Rate and Rhythm: Normal rate and regular rhythm.     Heart sounds: No murmur heard. Pulmonary:     Effort: Pulmonary effort is normal.     Breath sounds: Normal breath  sounds. No wheezing, rhonchi or rales.  Musculoskeletal:     Cervical back: Neck supple.  Lymphadenopathy:     Cervical: No cervical adenopathy.  Neurological:     Mental Status: She is alert.  Psychiatric:        Mood and Affect: Mood normal.        Behavior: Behavior normal.    Lab Results  Component Value Date   WBC 18.9 (H) 05/04/2021   HGB 9.9 (L) 05/04/2021   HCT 30.5 (L) 05/04/2021   PLT 333 05/04/2021   GLUCOSE 84 05/03/2021   ALT 8 05/03/2021   AST 15 05/03/2021   NA 134 (L) 05/03/2021   K 4.5 05/03/2021   CL 106 05/03/2021   CREATININE 0.59 05/03/2021   BUN 7 05/03/2021   CO2 22 05/03/2021   TSH 1.109 02/05/2013     Assessment & Plan:   Problem List Items Addressed This Visit       Respiratory   Respiratory infection - Primary    Patient's been sick for the past week and has had initial improvement with worsening again, i.e. double sickening.  Placing on doxycycline to cover for bacterial sinusitis and possible bronchitis.  Promethazine DM for cough.      Relevant Medications   doxycycline (VIBRAMYCIN) 100 MG capsule    Meds ordered this encounter  Medications   doxycycline (VIBRAMYCIN) 100 MG capsule    Sig: Take 1 capsule (100 mg total) by mouth 2 (two) times daily.    Dispense:  14 capsule    Refill:  0   promethazine-dextromethorphan (PROMETHAZINE-DM) 6.25-15 MG/5ML syrup    Sig: Take 5 mLs by mouth 4 (four) times daily as needed for cough.    Dispense:  118 mL    Refill:  0    Follow-up:  PRN  [place]

## 2021-09-08 ENCOUNTER — Telehealth: Payer: Self-pay | Admitting: Family Medicine

## 2021-09-08 NOTE — Telephone Encounter (Signed)
Patient advised per Dr Lacinda Axon: 600 to 800 mg of Ibuprofen every 8 hours as needed. Patient verbalized understanding.

## 2021-09-08 NOTE — Telephone Encounter (Signed)
Cook, Jayce G, DO    600 to 800 mg of Ibuprofen every 8 hours as needed.

## 2021-09-08 NOTE — Telephone Encounter (Signed)
Patient went to walk-in clinic yesterday and found out she has the flu and headache. She want to know how many Ibuprofen she can take and what mg . Please advise

## 2021-10-06 ENCOUNTER — Other Ambulatory Visit: Payer: Self-pay

## 2021-10-06 ENCOUNTER — Encounter: Payer: Self-pay | Admitting: Nurse Practitioner

## 2021-10-06 ENCOUNTER — Ambulatory Visit (INDEPENDENT_AMBULATORY_CARE_PROVIDER_SITE_OTHER): Payer: 59 | Admitting: Nurse Practitioner

## 2021-10-06 VITALS — BP 114/73 | HR 94 | Temp 98.0°F | Ht 66.0 in | Wt 187.8 lb

## 2021-10-06 DIAGNOSIS — J029 Acute pharyngitis, unspecified: Secondary | ICD-10-CM | POA: Diagnosis not present

## 2021-10-06 LAB — POCT RAPID STREP A (OFFICE): Rapid Strep A Screen: NEGATIVE

## 2021-10-06 MED ORDER — PENICILLIN V POTASSIUM 500 MG PO TABS: 500.0000 mg | ORAL_TABLET | Freq: Two times a day (BID) | ORAL | 0 refills | Status: DC

## 2021-10-06 NOTE — Progress Notes (Signed)
° °  Subjective:    Patient ID: Paula Houston, female    DOB: 08-03-1996, 25 y.o.   MRN: [id number]  HPI Patient reports to clinic with intermittent sore throat x3 days. Patient states that she feels like her tonsils are swollen. Patient states that her throat hurts to swallow and the back of her throat hurts when she breaths. Gargling salt water and hot showers help to relieve throat pain. Patient denies fever, chills, body aches, sinus pressure, ear pain.   Patient states that she is concerned about strep because she has a 57 month old baby.   Review of Systems  Constitutional: Negative.   HENT: Negative.    Respiratory: Negative.    Cardiovascular: Negative.   Gastrointestinal: Negative.       Objective:   Physical Exam Constitutional:      General: She is not in acute distress.    Appearance: She is well-developed and normal weight. She is not ill-appearing or toxic-appearing.  HENT:     Head: Normocephalic and atraumatic.     Right Ear: Tympanic membrane and ear canal normal.     Left Ear: Tympanic membrane and ear canal normal.     Nose: No congestion or rhinorrhea.     Mouth/Throat:     Mouth: Mucous membranes are moist. No oral lesions.     Pharynx: Uvula midline. Oropharyngeal exudate and posterior oropharyngeal erythema present. No pharyngeal swelling or uvula swelling.     Tonsils: Tonsillar exudate present. No tonsillar abscesses. 1+ on the right. 1+ on the left.  Cardiovascular:     Rate and Rhythm: Normal rate and regular rhythm.     Pulses: Normal pulses.     Heart sounds: Normal heart sounds. No murmur heard. Pulmonary:     Effort: Pulmonary effort is normal. No respiratory distress.     Breath sounds: Normal breath sounds. No wheezing.  Musculoskeletal:        General: Normal range of motion.     Cervical back: Normal range of motion and neck supple. No rigidity or tenderness.  Lymphadenopathy:     Cervical: No cervical adenopathy.  Skin:    General: Skin  is warm.  Neurological:     General: No focal deficit present.     Mental Status: She is alert and oriented to person, place, and time.  Psychiatric:        Mood and Affect: Mood normal.        Behavior: Behavior normal.          Assessment & Plan:   1. Sore throat - Likely Strep. However, COVID and Flu considered.  - Culture, Group A Strep - POCT rapid strep A = NEGATIVE - penicillin v potassium (VEETID) 500 MG tablet; Take 1 tablet (500 mg total) by mouth 2 (two) times daily for 10 days.  Dispense: 20 tablet; Refill: 0 - COVID-19, Flu A+B and RSV - Will treat prophylactically due to patient having an infant at home. Discuss s/e profile of medication in detail (rash and GI upset). If Strep cx results as negative, patient to stop PCN abx.  - RTC in 7-10 days or sooner if not better or worse.

## 2021-10-07 ENCOUNTER — Ambulatory Visit: Payer: 59 | Admitting: Nurse Practitioner

## 2021-10-07 LAB — COVID-19, FLU A+B AND RSV
Influenza A, NAA: NOT DETECTED
Influenza B, NAA: NOT DETECTED
RSV, NAA: NOT DETECTED
SARS-CoV-2, NAA: NOT DETECTED

## 2021-10-07 LAB — SPECIMEN STATUS REPORT

## 2021-10-09 ENCOUNTER — Encounter: Payer: Self-pay | Admitting: Nurse Practitioner

## 2021-10-09 LAB — CULTURE, GROUP A STREP

## 2021-10-09 NOTE — Telephone Encounter (Signed)
The bacteria is beta-hemolytic not the traditional strep bacteria  It is a colonizer-meaning that it is a bacteria that many people can carry in her throat but is not the source of problems  Anyways the antibiotic that Docia Barrier started would cover that as well  If any ongoing troubles recommend follow-up with Dr. Lacinda Axon or Docia Barrier

## 2021-10-10 MED ORDER — AMOXICILLIN 500 MG PO CAPS: 500.0000 mg | ORAL_CAPSULE | Freq: Two times a day (BID) | ORAL | 0 refills | Status: AC

## 2021-10-10 NOTE — Addendum Note (Signed)
Addended by: Claire Shown on: 10/10/2021 11:44 AM   Modules accepted: Orders

## 2021-10-10 NOTE — Progress Notes (Signed)
Called patient at [phone] to notify patient of result. Patient negative for Strep A buts still symptomatic with beta-hemolytic colonies. Patient was ordered PCN on day of service and has taken 1 dose. Patient states that PCN causing GI upset and would like to try amoxicillin since she has tolerated that better in the past. Amoxicillin 500mg  BID x10 days ordered and sent to patient's pharmacy. RTC to clinic if not better within 3-4 days of taking abx or if getting worse.

## 2021-10-14 ENCOUNTER — Encounter: Payer: Self-pay | Admitting: Nurse Practitioner

## 2022-01-08 ENCOUNTER — Encounter: Payer: Self-pay | Admitting: Nurse Practitioner

## 2022-01-17 ENCOUNTER — Ambulatory Visit
Admission: EM | Admit: 2022-01-17 | Discharge: 2022-01-17 | Disposition: A | Discharge status: Home / Self Care | Payer: BC Managed Care – PPO | Attending: Family Medicine | Admitting: Family Medicine

## 2022-01-17 DIAGNOSIS — R5383 Other fatigue: Secondary | ICD-10-CM

## 2022-01-17 DIAGNOSIS — R7309 Other abnormal glucose: Secondary | ICD-10-CM

## 2022-01-17 DIAGNOSIS — R42 Dizziness and giddiness: Secondary | ICD-10-CM

## 2022-01-17 LAB — POCT FASTING CBG KUC MANUAL ENTRY: POCT Glucose (KUC): 126 mg/dL — AB (ref 70–99)

## 2022-01-17 NOTE — ED Triage Notes (Signed)
Pt states they went out last night drinking ? ?Pt states she has gestational diabetes since being pregnant  ? ?Pt states she woke up this morning with diarrhea, nausea and vomiting with lightheadedness ? ?She states when she 1st got up at 7:30am this morning her sugar wad 122 and her mom gave her Gatorade and it is now 147.  ?

## 2022-01-17 NOTE — ED Provider Notes (Signed)
?[facility] ? ? ? ?CSN: [id number] ?Arrival date & time: 01/17/22  0944 ? ? ?  ? ?History   ?Chief Complaint ?Chief Complaint  ?Patient presents with  ? Nausea  ?  Throwing up, light headed, high than normal blood sugar - Entered by patient  ? ? ?HPI ?Paula Houston is a 26 y.o. female.  ? ?Patient presenting today concerned about elevated blood sugar readings.  She states that she drink a fair amount of alcohol last night and woke up this morning feeling lightheaded, having some nausea and vomiting, Fatigue and just overall not feeling well so she checked her blood sugar and it was 122.  She checked it again a bit later and it was 147 which is the highest reading she is ever gotten.  She states she was diagnosed with gestational diabetes with her recent pregnancy and her sugars have continued to be slightly elevated ever since.  She has been working with her primary care provider on this.  She denies polyuria, polyphagia, polydipsia and has no subsequent nausea vomiting or abdominal pain at this time.  She is tolerating p.o. well.  Not trying anything for symptoms. ? ? ?Past Medical History:  ?Diagnosis Date  ? Anxiety   ? Gestational diabetes   ? Medical history non-contributory   ? Painful menstrual periods 06/04/2014  ? PCOS (polycystic ovarian syndrome)   ? ? ?Patient Active Problem List  ? Diagnosis Date Noted  ? Respiratory infection 08/18/2021  ? Analgesic rebound headache 06/28/2021  ? History of gestational diabetes 06/26/2021  ? Migraine without aura and without status migrainosus, not intractable 06/26/2021  ? PCOS (polycystic ovarian syndrome) 06/08/2016  ? Painful menstrual periods 06/04/2014  ? ? ?Past Surgical History:  ?Procedure Laterality Date  ? CESAREAN SECTION N/A 12/25/2016  ? Procedure: CESAREAN SECTION;  Surgeon: Lavonia Drafts, MD;  Location: [facility];  Service: Obstetrics;  Laterality: N/A;  ? WISDOM TOOTH EXTRACTION    ? ? ?OB History   ? ? Gravida  ?3  ?  Para  ?3  ? Term  ?3  ? Preterm  ?0  ? AB  ?0  ? Living  ?3  ?  ? ? SAB  ?0  ? IAB  ?0  ? Ectopic  ?0  ? Multiple  ?0  ? Live Births  ?3  ?   ?  ?  ? ? ? ?Home Medications   ? ?Prior to Admission medications   ?Medication Sig Start Date End Date Taking? Authorizing Provider  ?etonogestrel-ethinyl estradiol (NUVARING) 0.12-0.015 MG/24HR vaginal ring Insert vaginally and leave in place for 3 consecutive weeks, then remove for 1 week. 06/10/21   Myrtis Ser, CNM  ?ferrous sulfate 325 (65 FE) MG tablet Take 1 tablet (325 mg total) by mouth every other day. ?Patient not taking: Reported on 10/06/2021 05/05/21 05/05/22  Erskine Emery, MD  ?ibuprofen (ADVIL) 600 MG tablet Take 1 tablet (600 mg total) by mouth every 6 (six) hours. ?Patient not taking: Reported on 10/06/2021 05/05/21   Erskine Emery, MD  ?Prenatal Vit-Fe Fumarate-FA (PRENATAL PO) Take by mouth. ?Patient not taking: Reported on 10/06/2021    [provider]  ?promethazine-dextromethorphan (PROMETHAZINE-DM) 6.25-15 MG/5ML syrup Take 5 mLs by mouth 4 (four) times daily as needed for cough. ?Patient not taking: Reported on 10/06/2021 08/18/21   Coral Spikes, DO  ?topiramate (TOPAMAX) 50 MG tablet Take 1/2 tab po qhs x 6 days then one po qhs for migraine prevention ?  Patient not taking: Reported on 10/06/2021 06/26/21   Nilda Simmer, NP  ? ? ?Family History ?Family History  ?Problem Relation Age of Onset  ? COPD Paternal Grandmother   ? Diabetes Mother   ? Diabetes Brother   ? ? ?Social History ?Social History  ? ?Tobacco Use  ? Smoking status: Never  ? Smokeless tobacco: Never  ?Vaping Use  ? Vaping Use: Never used  ?Substance Use Topics  ? Alcohol use: Yes  ?  Comment: Occas  ? Drug use: No  ? ? ? ?Allergies   ?Codeine ? ? ?Review of Systems ?Review of Systems ?Per HPI ? ?Physical Exam ?Triage Vital Signs ?ED Triage Vitals  ?Enc Vitals Group  ?   BP 01/17/22 1004 109/77  ?   Pulse Rate 01/17/22 1004 (!) 110  ?   Resp 01/17/22 1004 20  ?   Temp  01/17/22 1004 98.1 ?F (36.7 ?C)  ?   Temp Source 01/17/22 1004 Oral  ?   SpO2 01/17/22 1004 98 %  ?   Weight --   ?   Height --   ?   Head Circumference --   ?   Peak Flow --   ?   Pain Score 01/17/22 1001 5  ?   Pain Loc --   ?   Pain Edu? --   ?   Excl. in [place]? --   ? ?No data found. ? ?Updated Vital Signs ?BP 109/77 (BP Location: Right Arm)   Pulse (!) 110   Temp 98.1 ?F (36.7 ?C) (Oral)   Resp 20   LMP 12/30/2021 (Approximate) Comment: IUD  SpO2 98%   Breastfeeding No  ? ?Visual Acuity ?Right Eye Distance:   ?Left Eye Distance:   ?Bilateral Distance:   ? ?Right Eye Near:   ?Left Eye Near:    ?Bilateral Near:    ? ?Physical Exam ?Vitals and nursing note reviewed.  ?Constitutional:   ?   Appearance: Normal appearance. She is not ill-appearing.  ?HENT:  ?   Head: Atraumatic.  ?   Nose: Nose normal.  ?   Mouth/Throat:  ?   Mouth: Mucous membranes are moist.  ?   Pharynx: Oropharynx is clear. No posterior oropharyngeal erythema.  ?Eyes:  ?   Extraocular Movements: Extraocular movements intact.  ?   Conjunctiva/sclera: Conjunctivae normal.  ?   Pupils: Pupils are equal, round, and reactive to light.  ?Cardiovascular:  ?   Rate and Rhythm: Normal rate and regular rhythm.  ?   Heart sounds: Normal heart sounds.  ?Pulmonary:  ?   Effort: Pulmonary effort is normal.  ?   Breath sounds: Normal breath sounds.  ?Abdominal:  ?   General: Bowel sounds are normal. There is no distension.  ?   Palpations: Abdomen is soft.  ?   Tenderness: There is no abdominal tenderness. There is no guarding.  ?Musculoskeletal:     ?   General: Normal range of motion.  ?   Cervical back: Normal range of motion and neck supple.  ?Skin: ?   General: Skin is warm and dry.  ?Neurological:  ?   Mental Status: She is alert and oriented to person, place, and time.  ?   Cranial Nerves: No cranial nerve deficit.  ?   Motor: No weakness.  ?   Gait: Gait normal.  ?Psychiatric:     ?   Mood and Affect: Mood normal.     ?   Thought Content:  Thought  content normal.     ?   Judgment: Judgment normal.  ? ?UC Treatments / Results  ?Labs ?(all labs ordered are listed, but only abnormal results are displayed) ?Labs Reviewed  ?POCT FASTING [facility] MANUAL ENTRY - Abnormal; Notable for the following components:  ?    Result Value  ? POCT Glucose ([facility]) 126 (*)   ? All other components within normal limits  ? ? ?EKG ? ? ?Radiology ?No results found. ? ?Procedures ?Procedures (including critical care time) ? ?Medications Ordered in UC ?Medications - No data to display ? ?Initial Impression / Assessment and Plan / UC Course  ?I have reviewed the triage vital signs and the nursing notes. ? ?Pertinent labs & imaging results that were available during my care of the patient were reviewed by me and considered in my medical decision making (see chart for details). ? ?  ? ?Mildly tachycardic in triage, otherwise vital signs benign and reassuring today.  Suspect her symptoms related to mild dehydration and alcohol effects. Her random glucose today after drinking a Gatorade is 126.  Reassurance given that her sugar is not elevated to a point we would need to intervene at this time.  Given her ongoing mild elevations did recommend that she continue following with her primary care provider on this but mainly focusing on lifestyle modifications at this time to include diet and exercise.  Push fluids today, reduce carbohydrate intake including sugary beverages and continue to monitor.  She declines lab work today. ? ?Final Clinical Impressions(s) / UC Diagnoses  ? ?Final diagnoses:  ?Elevated glucose level  ?Other fatigue  ?Lightheaded  ? ?Discharge Instructions   ?None ?  ? ?ED Prescriptions   ?None ?  ? ?PDMP not reviewed this encounter. ?  ?Volney American, PA-C ?01/17/22 1100 ? ?

## 2022-02-05 ENCOUNTER — Encounter: Payer: Self-pay | Admitting: Nurse Practitioner

## 2022-02-05 ENCOUNTER — Ambulatory Visit (INDEPENDENT_AMBULATORY_CARE_PROVIDER_SITE_OTHER): Payer: BC Managed Care – PPO | Admitting: Nurse Practitioner

## 2022-02-05 VITALS — BP 122/70 | HR 98 | Temp 98.1°F | Wt 200.0 lb

## 2022-02-05 DIAGNOSIS — R635 Abnormal weight gain: Secondary | ICD-10-CM | POA: Diagnosis not present

## 2022-02-05 DIAGNOSIS — E282 Polycystic ovarian syndrome: Secondary | ICD-10-CM | POA: Diagnosis not present

## 2022-02-05 DIAGNOSIS — R5383 Other fatigue: Secondary | ICD-10-CM | POA: Diagnosis not present

## 2022-02-05 MED ORDER — PHENTERMINE HCL 37.5 MG PO TABS: 37.5000 mg | ORAL_TABLET | Freq: Every day | ORAL | 0 refills | Status: DC

## 2022-02-05 NOTE — Patient Instructions (Signed)
Wegovy Saxenda 

## 2022-02-05 NOTE — Progress Notes (Signed)
? ?Subjective:  ? ? Patient ID: Paula Houston, female    DOB: April 22, 1996, 26 y.o.   MRN: [id number] ? ?HPI ?Pt here due to fasting blood sugars still elevated and weight gain. Checking sugars in the morning periodically. Pt did have gestational diabetes. Had son in July and sugars have been elevated since.  ?Presents to discuss slightly elevated fasting blood sugars running an average of above 100, mostly 110-115.  Has noticed weight gain over the past few months.  Has been diagnosed with PCOS and had an abnormal glucose tolerance test during pregnancy.  Her mother has latent autoimmune type 1 diabetes and her brother has type 1 diabetes. ?Tries to eat healthy.  Has noticed increased sugars when she eats certain foods.  Has also had.'s of lightheadedness and feeling shaky.  Often eats on the run or late at night due to her busy schedule. ?Has also been having some intermittent sharp brief midsternal chest pain.  Unassociated with activity.  Can occur at rest.  Has not identified any specific triggers other than anxiety/agitation.  Has not identified any specific alleviating factors.  Has stopped drinking red bulls.  Drinks 1 Pepsi a day but otherwise no other caffeine.  No regular NSAID use.  No tobacco.  Rare alcohol use.  No spicy foods or excessive citrus in her diet.  Has noticed some symptoms with her bowels that she thinks is IBS including cycles of diarrhea alternating with constipation and feeling an urge to defecate with no results.  Denies any abdominal pain. ?Just started new insurance, unsure about coverage and has a very high deductible. ?Gave birth to her last child in July 2022. ?Review of Systems  ?Constitutional:  Positive for fatigue.  ?HENT:  Positive for sore throat. Negative for trouble swallowing.   ?Respiratory:  Positive for chest tightness. Negative for choking and shortness of breath.   ?Cardiovascular:  Positive for chest pain. Negative for palpitations and leg swelling.   ?Gastrointestinal:  Positive for abdominal pain, constipation and diarrhea. Negative for nausea and vomiting.  ?     Denies overt reflux symptoms.  ? ?   ?Objective:  ? Physical Exam ?NAD.  Alert, oriented.  Calm cheerful affect.  Thyroid nontender to palpation, no mass or goiter noted.  Lungs clear.  Heart regular rate rhythm without murmur or gallop.  Abdomen soft nondistended nontender without obvious masses.  No rebound or guarding.  Lower extremities no edema. ?Today's Vitals  ? 02/05/22 1031  ?BP: 122/70  ?Pulse: 98  ?Temp: 98.1 ?F (36.7 ?C)  ?SpO2: 99%  ?Weight: 200 lb (90.7 kg)  ? ?Body mass index is 32.28 kg/m?. ? ? ? ? ?   ?Assessment & Plan:  ? ?Problem List Items Addressed This Visit   ? ?  ? Endocrine  ? PCOS (polycystic ovarian syndrome) - Primary  ? Relevant Orders  ? Hemoglobin A1c  ? TSH  ? ?Other Visit Diagnoses   ? ? Fatigue, unspecified type      ? Relevant Orders  ? Hemoglobin A1c  ? TSH  ? Weight gain      ? Relevant Orders  ? Hemoglobin A1c  ? TSH  ? ?  ? ?Lab work pending.  Assessing for postpartum thyroiditis and checking A1c.  We will hold on further labs until it is clear how much her insurance will cover. ?Meds ordered this encounter  ?Medications  ? phentermine (ADIPEX-P) 37.5 MG tablet  ?  Sig: Take 1 tablet (37.5 mg total)  by mouth daily before breakfast.  ?  Dispense:  30 tablet  ?  Refill:  0  ?  Order Specific Question:   Supervising Provider  ?  Answer:   Sallee Lange A [[id number]]  ? ?Discussed the difference between type I and type 2 diabetes.  Patient took metformin for PCOS when she was trying to get pregnant but had persistent diarrhea.  Is willing to reconsider but we will focus on weight loss medicine until we get the lab work back. ?Start phentermine as directed.  Reviewed potential adverse effects.  Discontinue medication and contact office if any problems. ?Encouraged healthy lifestyle including healthy diet and regular activity. ?Based on her description, it is likely that  the chest pain is caused by acid reflux and she also has symptoms of IBS.  Defers any medication for stress or anxiety at this time. ?Return in about 1 month (around 03/07/2022). ?Call back sooner if needed. ?

## 2022-02-06 LAB — HEMOGLOBIN A1C
Est. average glucose Bld gHb Est-mCnc: 100 mg/dL
Hgb A1c MFr Bld: 5.1 % (ref 4.8–5.6)

## 2022-02-06 LAB — TSH: TSH: 1.95 u[IU]/mL (ref 0.450–4.500)

## 2022-03-05 ENCOUNTER — Encounter: Payer: Self-pay | Admitting: Nurse Practitioner

## 2022-03-05 ENCOUNTER — Ambulatory Visit: Payer: BC Managed Care – PPO | Admitting: Nurse Practitioner

## 2022-03-05 ENCOUNTER — Ambulatory Visit (INDEPENDENT_AMBULATORY_CARE_PROVIDER_SITE_OTHER): Payer: BC Managed Care – PPO | Admitting: Nurse Practitioner

## 2022-03-05 VITALS — BP 107/73 | HR 83 | Temp 97.9°F | Wt 185.6 lb

## 2022-03-05 DIAGNOSIS — E282 Polycystic ovarian syndrome: Secondary | ICD-10-CM

## 2022-03-05 DIAGNOSIS — E669 Obesity, unspecified: Secondary | ICD-10-CM | POA: Diagnosis not present

## 2022-03-05 MED ORDER — PHENTERMINE HCL 37.5 MG PO TABS: 37.5000 mg | ORAL_TABLET | Freq: Every day | ORAL | 2 refills | Status: DC

## 2022-03-05 NOTE — Progress Notes (Unsigned)
   Subjective:    Patient ID: Paula Houston, female    DOB: 11/03/1995, 26 y.o.   MRN: [id number]  HPI Pt here for follow up. Pt is taking Phentermine 37.5 mg and doing well. No issues.  No CP or palpitations.  Eating smaller portions.  Activity: "on the go all the time". Drinking more water.  Review of Systems  Respiratory:  Negative for chest tightness and shortness of breath.   Cardiovascular:  Negative for chest pain and palpitations.      Objective:   Physical Exam NAD. Alert, oriented. Lungs clear. Heart RRR.  Has lost 15 lbs over the past month. Today's Vitals   03/05/22 1507  BP: 107/73  Pulse: 83  Temp: 97.9 F (36.6 C)  SpO2: 100%  Weight: 185 lb 9.6 oz (84.2 kg)   Body mass index is 29.96 kg/m.        Assessment & Plan:   Problem List Items Addressed This Visit       Endocrine   PCOS (polycystic ovarian syndrome) - Primary     Other   Obesity (BMI 30-39.9)   Relevant Medications   phentermine (ADIPEX-P) 37.5 MG tablet   Meds ordered this encounter  Medications   phentermine (ADIPEX-P) 37.5 MG tablet    Sig: Take 1 tablet (37.5 mg total) by mouth daily before breakfast.    Dispense:  30 tablet    Refill:  2    Order Specific Question:   Supervising Provider    Answer:   Sallee Lange A [[id number]]   Continue Phentermine for 3 more months. Continue healthy lifestyle changes.  Follow up in 3 months if she wants to discuss continuing medication.

## 2022-03-06 ENCOUNTER — Encounter: Payer: Self-pay | Admitting: Nurse Practitioner

## 2022-04-20 ENCOUNTER — Ambulatory Visit: Payer: BC Managed Care – PPO | Admitting: Women's Health

## 2022-04-30 ENCOUNTER — Encounter: Payer: Self-pay | Admitting: Adult Health

## 2022-04-30 ENCOUNTER — Ambulatory Visit: Payer: BC Managed Care – PPO | Admitting: Adult Health

## 2022-04-30 VITALS — BP 124/84 | HR 106 | Ht 66.0 in | Wt 170.0 lb

## 2022-04-30 DIAGNOSIS — K649 Unspecified hemorrhoids: Secondary | ICD-10-CM

## 2022-04-30 LAB — HEMOCCULT GUIAC POC 1CARD (OFFICE): Fecal Occult Blood, POC: NEGATIVE

## 2022-04-30 NOTE — Progress Notes (Signed)
  Subjective:     Patient ID: Paula Houston, female   DOB: October 26, 1995, 26 y.o.   MRN: [id number]  HPI Paula Houston is a  26 year old white female, married,G3P3, in complaining of hemorrhoids. Lab Results  Component Value Date   DIAGPAP  10/29/2020    - Negative for Intraepithelial Lesions or Malignancy (NILM)   DIAGPAP - Benign reactive/reparative changes 10/29/2020   PCP is Dr Lacinda Axon.  Review of Systems Has hemorrhoids Has had ?IBS  C/D, but is better now Reviewed past medical,surgical, social and family history. Reviewed medications and allergies.     Objective:   Physical Exam BP 124/84 (BP Location: Left Arm, Patient Position: Sitting, Cuff Size: Normal)   Pulse (!) 106   Ht '5\' 6"'$  (1.676 m)   Wt 170 lb (77.1 kg)   LMP 04/28/2022 (Approximate)   Breastfeeding No   BMI 27.44 kg/m     Skin warm and dry, on rectal exam, has external hemorrhoid, no clots, has good tone, no internal hemorrhoids felt, and hemoccult was negative.  Upstream - 04/30/22 1133       Pregnancy Intention Screening   Does the patient want to become pregnant in the next year? No    Does the patient's partner want to become pregnant in the next year? No    Would the patient like to discuss contraceptive options today? No      Contraception Wrap Up   Current Method Vaginal Ring    End Method Vaginal Ring            Examination chaperoned by [name] LPN  Assessment:     1. Hemorrhoids, unspecified hemorrhoid type Use wet wipes and TUCKs Will rx compounded Hemorrhoid cream from [facility] #30 gm use tid with 1 refill If persists can refer to surgeon     Plan:     Follow up prn

## 2022-06-15 ENCOUNTER — Ambulatory Visit (HOSPITAL_COMMUNITY)
Admission: RE | Admit: 2022-06-15 | Discharge: 2022-06-15 | Disposition: A | Discharge status: Home / Self Care | Payer: BC Managed Care – PPO | Source: Ambulatory Visit | Attending: Nurse Practitioner | Admitting: Nurse Practitioner

## 2022-06-15 ENCOUNTER — Encounter: Payer: Self-pay | Admitting: Nurse Practitioner

## 2022-06-15 ENCOUNTER — Ambulatory Visit: Payer: BC Managed Care – PPO | Admitting: Nurse Practitioner

## 2022-06-15 VITALS — BP 122/78 | HR 84 | Temp 98.1°F | Wt 167.6 lb

## 2022-06-15 DIAGNOSIS — M79642 Pain in left hand: Secondary | ICD-10-CM

## 2022-06-15 NOTE — Progress Notes (Signed)
   Subjective:    Patient ID: Paula Houston, female    DOB: Aug 27, 1996, 26 y.o.   MRN: [id number]  HPI Pt arrives to office for intermittent left hand pain x 7 days.  Pain began in her fingers and she tried to pop them; pain increased.  Patient also reports pain near her thumb.  Patient states that sometimes when she is trying to pick up things with her thumb index finger and third finger it will hurt.  Patient states that sometimes changing her son's diaper will also cause pain.  Yesterday she noticed hand tingling and numbness when she was in the shower.   Patient denies any injuries to her left hand.  However, patient states that she she was painting her whole house and noticed that the pain to her left hand occurred a few days after.  Patient denies any fever, chills, body aches, chest pain, shortness of breath, joint swelling, weakness.  Review of Systems  Musculoskeletal:        Left hand pain  All other systems reviewed and are negative.      Objective:   Physical Exam Constitutional:      General: She is not in acute distress.    Appearance: Normal appearance. She is normal weight. She is not ill-appearing, toxic-appearing or diaphoretic.  HENT:     Head: Normocephalic and atraumatic.  Musculoskeletal:        General: Normal range of motion.     Comments: Range of motion to left hand intact.  Strength to wrist and fingers 5 out of 5.  Pain noted with wrist flexion against resistance.  No pain noted with wrist extension. Phalen's test negative.  Neurological:     Mental Status: She is alert.  Psychiatric:        Mood and Affect: Mood normal.        Behavior: Behavior normal.         Assessment & Plan:   1. Left hand pain -Possible overuse injury or impinged nerve -We will get hand x-ray to rule out fractures -If x-ray negative will manage pain with ibuprofen - DG Hand Complete Left -If pain persist past 2 weeks return to clinic for reevaluation -Patient may  benefit from physical therapy as well -Return to clinic if pain persist or worsens    Note:  This document was prepared using Dragon voice recognition software and may include unintentional dictation errors. Note - This record has been created using [employer].  Chart creation errors have been sought, but may not always  have been located. Such creation errors do not reflect on  the standard of medical care.

## 2022-06-16 ENCOUNTER — Encounter: Payer: Self-pay | Admitting: Nurse Practitioner

## 2022-06-17 ENCOUNTER — Other Ambulatory Visit: Payer: Self-pay | Admitting: Nurse Practitioner

## 2022-06-17 DIAGNOSIS — M79642 Pain in left hand: Secondary | ICD-10-CM

## 2022-06-17 MED ORDER — PREDNISONE 20 MG PO TABS: 20.0000 mg | ORAL_TABLET | Freq: Every day | ORAL | 0 refills | Status: AC

## 2022-06-22 ENCOUNTER — Ambulatory Visit: Payer: BC Managed Care – PPO | Admitting: Women's Health

## 2022-06-22 ENCOUNTER — Encounter: Payer: Self-pay | Admitting: Women's Health

## 2022-06-22 VITALS — BP 125/72 | HR 99 | Wt 179.0 lb

## 2022-06-22 DIAGNOSIS — F418 Other specified anxiety disorders: Secondary | ICD-10-CM | POA: Insufficient documentation

## 2022-06-22 MED ORDER — SERTRALINE HCL 25 MG PO TABS: 25.0000 mg | ORAL_TABLET | Freq: Every day | ORAL | 3 refills | Status: DC

## 2022-06-22 NOTE — Progress Notes (Signed)
GYN VISIT Patient name: ILETA OFARRELL MRN [id number]  Date of birth: 1995/11/13 Chief Complaint:   Depression  History of Present Illness:   HOANG REICH is a 26 y.o. G26P3003 Caucasian female being seen today for report of dep/anx. Irritable w/ family. On phentermine by PCP, appetite wnl. Sleeps heavy. Still finds joy in some things, but then doesn't want to do other things like grocery shopping, etc. Denies SI/HI. Has started affecting marriage.  No LMP recorded. The current method of family planning is NuvaRing vaginal inserts.  Last pap 10/29/20. Results were: NILM w/ HRHPV not done     06/22/2022   10:42 AM 08/18/2021    2:46 PM 06/26/2021   11:06 AM 02/18/2021    8:30 AM 02/11/2021    9:35 AM  Depression screen PHQ 2/9  Decreased Interest 1 0 0 0 0  Down, Depressed, Hopeless 1 0 0 0 0  PHQ - 2 Score 2 0 0 0 0  Altered sleeping 1    0  Tired, decreased energy 1    2  Change in appetite 1    0  Feeling bad or failure about yourself  1    0  Trouble concentrating 0    0  Moving slowly or fidgety/restless 0    0  Suicidal thoughts 0    0  PHQ-9 Score 6    2        06/22/2022   10:43 AM 02/11/2021    9:35 AM 10/29/2020    9:34 AM  GAD 7 : Generalized Anxiety Score  Nervous, Anxious, on Edge 1 0 1  Control/stop worrying 1 0 0  Worry too much - different things 1 0 0  Trouble relaxing 1 0 0  Restless 0 0 0  Easily annoyed or irritable 1 0 1  Afraid - awful might happen 1 0 0  Total GAD 7 Score 6 0 2     Review of Systems:   Pertinent items are noted in HPI Denies fever/chills, dizziness, headaches, visual disturbances, fatigue, shortness of breath, chest pain, abdominal pain, vomiting, abnormal vaginal discharge/itching/odor/irritation, problems with periods, bowel movements, urination, or intercourse unless otherwise stated above.  Pertinent History Reviewed:  Reviewed past medical,surgical, social, obstetrical and family history.  Reviewed problem list,  medications and allergies. Physical Assessment:   Vitals:   06/22/22 1043  BP: 125/72  Pulse: 99  Weight: 179 lb (81.2 kg)  Body mass index is 28.89 kg/m.       Physical Examination:   General appearance: alert, well appearing, and in no distress  Mental status: alert, oriented to person, place, and time  Skin: warm & dry   Cardiovascular: normal heart rate noted  Respiratory: normal respiratory effort, no distress  Abdomen: soft, non-tender   Pelvic: examination not indicated  Extremities: no edema   Chaperone: N/A    No results found for this or any previous visit (from the past 24 hour(s)).  Assessment & Plan:  1) Dep/anx/irritable> discussed options, wants to try meds and [facility] referral. Rx zoloft '25mg'$ , understands can take a few weeks to notice improvement. [facility]BH referral ordered. Has high copay at our office, will send me a mychart message in 4wks to let me know how things are going on zoloft.   Meds:  Meds ordered this encounter  Medications   sertraline (ZOLOFT) 25 MG tablet    Sig: Take 1 tablet (25 mg total) by mouth daily.    Dispense:  90  tablet    Refill:  3    Order Specific Question:   Supervising Provider    Answer:   Florian Buff [[id number]]    Orders Placed This Encounter  Procedures   Amb ref to [facility]    Return for pt to send mychart message in 4wks to f/u. 57yrfor physical.  KRoma SchanzCNM, WHNP-BC 06/22/2022 11:13 AM

## 2022-06-29 ENCOUNTER — Other Ambulatory Visit: Payer: Self-pay | Admitting: Women's Health

## 2022-06-29 ENCOUNTER — Telehealth: Payer: Self-pay | Admitting: Clinical

## 2022-06-29 MED ORDER — ETONOGESTREL-ETHINYL ESTRADIOL 0.12-0.015 MG/24HR VA RING: VAGINAL_RING | VAGINAL | 3 refills | Status: DC

## 2022-06-29 NOTE — Telephone Encounter (Signed)
Attempt call regarding referral; Unable to leave voice message as mailbox is full.

## 2022-07-05 ENCOUNTER — Encounter: Payer: Self-pay | Admitting: Family Medicine

## 2022-07-05 ENCOUNTER — Other Ambulatory Visit: Payer: Self-pay

## 2022-07-05 ENCOUNTER — Encounter (HOSPITAL_COMMUNITY): Payer: Self-pay | Admitting: Emergency Medicine

## 2022-07-05 ENCOUNTER — Emergency Department (HOSPITAL_COMMUNITY)
Admission: EM | Admit: 2022-07-05 | Discharge: 2022-07-05 | Disposition: A | Discharge status: Home / Self Care | Payer: BC Managed Care – PPO | Attending: Student | Admitting: Student

## 2022-07-05 DIAGNOSIS — M79642 Pain in left hand: Secondary | ICD-10-CM

## 2022-07-05 MED ORDER — DICLOFENAC SODIUM 1 % EX GEL: 4.0000 g | Freq: Four times a day (QID) | CUTANEOUS | 1 refills | Status: DC

## 2022-07-05 NOTE — Discharge Instructions (Signed)
Please follow-up with your primary care provider. Please use Voltaren gel Tylenol and ibuprofen as discussed below  Please use Tylenol or ibuprofen for pain.  You may use 600 mg ibuprofen every 6 hours or 1000 mg of Tylenol every 6 hours.  You may choose to alternate between the 2.  This would be most effective.  Not to exceed 4 g of Tylenol within 24 hours.  Not to exceed 3200 mg ibuprofen 24 hours.

## 2022-07-05 NOTE — ED Triage Notes (Signed)
Pt to the ED with left arm pain for the past 4 weeks.  Pt states her arm is painful from her shoulder down to her thumb.

## 2022-07-05 NOTE — ED Provider Notes (Signed)
[facility] EMERGENCY DEPARTMENT Provider Note   CSN: [id number] Arrival date & time: 07/05/22  1138     History  Chief Complaint  Patient presents with   Arm Injury    Paula Houston is a 26 y.o. female.   Arm Injury Patient is a 26 year old female presented emergency room today with complaints of left hand pain that seems to be primarily between her thumb and first finger left hand.  She states that seems to radiate up her arm. No numbness or paresthesias.  No weakness.  States she has intermittent pain.  Seems to be worse with certain movements.  She states she picked up a box recently and had significant pain in her hand and dropped the box as a result.   Denies any trauma.  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  No chest pain difficulty breathing.  No other associate symptoms.    Home Medications Prior to Admission medications   Medication Sig Start Date End Date Taking? Authorizing Provider  diclofenac Sodium (VOLTAREN) 1 % GEL Apply 4 g topically 4 (four) times daily. 07/05/22  Yes Tedd Sias, PA  etonogestrel-ethinyl estradiol (NUVARING) 0.12-0.015 MG/24HR vaginal ring Insert vaginally and leave in place for 3 consecutive weeks, then remove for 1 week. 06/29/22   Roma Schanz, CNM  phentermine (ADIPEX-P) 37.5 MG tablet Take 1 tablet (37.5 mg total) by mouth daily before breakfast. 03/05/22   Nilda Simmer, NP  sertraline (ZOLOFT) 25 MG tablet Take 1 tablet (25 mg total) by mouth daily. 06/22/22   Roma Schanz, CNM      Allergies    Patient has no known allergies.    Review of Systems   Review of Systems  Physical Exam Updated Vital Signs BP 128/87 (BP Location: Right Arm)   Pulse 87   Temp 98 F (36.7 C) (Oral)   Resp 20   Ht '5\' 6"'$  (1.676 m)   Wt 81.2 kg   LMP 06/28/2022   SpO2 97%   BMI 28.89 kg/m  Physical Exam Vitals and nursing note reviewed.  Constitutional:      General: She is  not in acute distress.    Appearance: Normal appearance. She is not ill-appearing.  HENT:     Head: Normocephalic and atraumatic.  Eyes:     General: No scleral icterus.       Right eye: No discharge.        Left eye: No discharge.     Conjunctiva/sclera: Conjunctivae normal.  Cardiovascular:     Comments: Bilateral radial artery pulses 2+ and symmetric. Pulmonary:     Effort: Pulmonary effort is normal.     Breath sounds: No stridor.  Skin:    General: Skin is warm and dry.  Neurological:     Mental Status: She is alert and oriented to person, place, and time. Mental status is at baseline.     Comments: Sensation intact in all fingers of left hand  Psychiatric:        Mood and Affect: Mood normal.        Behavior: Behavior normal.     ED Results / Procedures / Treatments   Labs (all labs ordered are listed, but only abnormal results are displayed) Labs Reviewed - No data to display  EKG None  Radiology No results found.  Procedures Procedures    Medications Ordered in ED Medications - No data to display  ED Course/  Medical Decision Making/ A&P                           Medical Decision Making  Patient is a 26 year old female presented emergency room today with complaints of left hand pain that seems to be primarily between her thumb and first finger left hand.  She states that seems to radiate up her arm. No numbness or paresthesias.  No weakness.  States she has intermittent pain.  Seems to be worse with certain movements.  She states she picked up a box recently and had significant pain in her hand and dropped the box as a result.   Denies any trauma.  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  No chest pain difficulty breathing.  No other associate symptoms.  She is distally neurovascularly intact.  Atraumatic left hand pain seems to be muscular in origin. She was given prednisone by PCP and couldn't tolerate the side  effects (irritability).   Recommend Voltaren gel, time and follow-up with PCP.  Return precautions discussed  Final Clinical Impression(s) / ED Diagnoses Final diagnoses:  Left hand pain    Rx / DC Orders ED Discharge Orders          Ordered    diclofenac Sodium (VOLTAREN) 1 % GEL  4 times daily        07/05/22 1304              Pati Gallo [place], [place] 07/05/22 1543    Teressa Lower, MD 07/06/22 [id number]

## 2022-07-06 NOTE — Telephone Encounter (Signed)
Coral Spikes, DO     Please place referral: diagnosis - left hand pain.  Thank you   Dr. Lacinda Axon

## 2022-07-19 ENCOUNTER — Encounter: Payer: Self-pay | Admitting: Women's Health

## 2022-07-23 DIAGNOSIS — M654 Radial styloid tenosynovitis [de Quervain]: Secondary | ICD-10-CM | POA: Diagnosis not present

## 2022-08-17 NOTE — BH Specialist Note (Deleted)
Integrated Behavioral Health via Telemedicine Visit  08/17/2022 Paula Houston [id number]  Number of [place] Clinician visits: No data recorded Session Start time: No data recorded  Session End time: No data recorded Total time in minutes: No data recorded  Referring Provider: Wells Houston, CNM Patient/Family location: Home*** [facility] Provider location: [facility] [place] [facility]  All persons participating in visit: Patient Paula Houston and Paula Houston ***   Types of Service: {CHL AMB TYPE OF SERVICE:4340279264}  I connected with Paula Houston and/or Paula Houston's {family members:20773} via  Telephone or Video Enabled Telemedicine Application  (Video is Caregility application) and verified that I am speaking with the correct person using two identifiers. Discussed confidentiality: Yes   I discussed the limitations of telemedicine and the availability of in person appointments.  Discussed there is a possibility of technology failure and discussed alternative modes of communication if that failure occurs.  I discussed that engaging in this telemedicine visit, they consent to the provision of behavioral healthcare and the services will be billed under their insurance.  Patient and/or legal guardian expressed understanding and consented to Telemedicine visit: Yes   Presenting Concerns: Patient and/or family reports the following symptoms/concerns: *** Duration of problem: ***; Severity of problem: {Mild/Moderate/Severe:20260}  Patient and/or Family's Strengths/Protective Factors: {CHL AMB BH PROTECTIVE [phone]}  Goals Addressed: Patient will:  Reduce symptoms of: {IBH Symptoms:21014056}   Increase knowledge and/or ability of: {IBH Patient Tools:21014057}   Demonstrate ability to: {IBH Goals:21014053}  Progress towards Goals: {CHL AMB BH PROGRESS TOWARDS  [phone]}  Interventions: Interventions utilized:  {IBH Interventions:21014054} Standardized Assessments completed: {IBH Screening Tools:21014051}  Patient and/or Family Response: Patient agrees with treatment plan. ***  Assessment: Patient currently experiencing ***.   Patient may benefit from psychoeducation and brief therapeutic interventions regarding coping with symptoms of *** .  Plan: Follow up with behavioral health clinician on : *** Behavioral recommendations:  -*** -*** Referral(s): {IBH Referrals:21014055}  I discussed the assessment and treatment plan with the patient and/or parent/guardian. They were provided an opportunity to ask questions and all were answered. They agreed with the plan and demonstrated an understanding of the instructions.   They were advised to call back or seek an in-person evaluation if the symptoms worsen or if the condition fails to improve as anticipated.  Paula Fair, LCSW     06/22/2022   10:42 AM 08/18/2021    2:46 PM 06/26/2021   11:06 AM 02/18/2021    8:30 AM 02/11/2021    9:35 AM  Depression screen PHQ 2/9  Decreased Interest 1 0 0 0 0  Down, Depressed, Hopeless 1 0 0 0 0  PHQ - 2 Score 2 0 0 0 0  Altered sleeping 1    0  Tired, decreased energy 1    2  Change in appetite 1    0  Feeling bad or failure about yourself  1    0  Trouble concentrating 0    0  Moving slowly or fidgety/restless 0    0  Suicidal thoughts 0    0  PHQ-9 Score 6    2      06/22/2022   10:43 AM 02/11/2021    9:35 AM 10/29/2020    9:34 AM  GAD 7 : Generalized Anxiety Score  Nervous, Anxious, on Edge 1 0 1  Control/stop worrying 1 0 0  Worry too much - different things 1 0 0  Trouble relaxing 1 0 0  Restless 0 0 0  Easily annoyed or irritable 1 0 1  Afraid - awful might happen 1 0 0  Total GAD 7 Score 6 0 2

## 2022-08-23 ENCOUNTER — Encounter: Payer: Self-pay | Admitting: Family Medicine

## 2022-08-23 ENCOUNTER — Ambulatory Visit: Payer: BC Managed Care – PPO | Admitting: Family Medicine

## 2022-08-23 ENCOUNTER — Encounter: Payer: Self-pay | Admitting: Clinical

## 2022-08-23 ENCOUNTER — Other Ambulatory Visit: Payer: Self-pay | Admitting: Family Medicine

## 2022-08-23 VITALS — BP 123/78 | HR 85 | Temp 98.2°F | Wt 174.6 lb

## 2022-08-23 DIAGNOSIS — J029 Acute pharyngitis, unspecified: Secondary | ICD-10-CM | POA: Diagnosis not present

## 2022-08-23 DIAGNOSIS — J020 Streptococcal pharyngitis: Secondary | ICD-10-CM | POA: Insufficient documentation

## 2022-08-23 LAB — POCT RAPID STREP A (OFFICE): Rapid Strep A Screen: POSITIVE — AB

## 2022-08-23 MED ORDER — AMOXICILLIN 500 MG PO TABS: 500.0000 mg | ORAL_TABLET | Freq: Two times a day (BID) | ORAL | 0 refills | Status: DC

## 2022-08-23 NOTE — Assessment & Plan Note (Signed)
Rapid strep positive.  Treating with amoxicillin. 

## 2022-08-23 NOTE — Progress Notes (Signed)
Subjective:  Patient ID: Paula Houston, female    DOB: [birth year]  Age: 26 y.o. MRN: [id number]  CC: Chief Complaint  Patient presents with   Sore Throat    Pt having sore throat, cough, nausea, headache, low grade fever last night. Cough is dry and began Saturday-other symptoms have progressed from today. Pt son did have RSV last week     HPI:  26 year old female presents with respiratory symptoms.  Symptoms started on Saturday.  She has had sore throat, cough, nausea, headache.  Low-grade temp.  Son has recently had RSV.  She is most bothered by the headache and sore throat.  He has taken DayQuil and ibuprofen without relief.  No other complaints or concerns at this time.  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   History of gestational diabetes 06/26/2021   Migraine without aura and without status migrainosus, not intractable 06/26/2021   PCOS (polycystic ovarian syndrome) 06/08/2016    Social Hx   Social History   Socioeconomic History   Marital status: Married    Spouse name: Quillian Quince   Number of children: 3   Years of education: Not on file   Highest education level: Not on file  Occupational History   Not on file  Tobacco Use   Smoking status: Never   Smokeless tobacco: Never  Vaping Use   Vaping Use: Never used  Substance and Sexual Activity   Alcohol use: Yes    Comment: Occas   Drug use: No   Sexual activity: Yes    Birth control/protection: Inserts  Other Topics Concern   Not on file  Social History Narrative   Not on file   Social Determinants of Health   Financial Resource Strain: Low Risk  (02/11/2021)   Overall Financial Resource Strain (CARDIA)    Difficulty of Paying Living Expenses: Not hard at all  Food Insecurity: No Food Insecurity (02/11/2021)   Hunger  Vital Sign    Worried About Running Out of Food in the Last Year: Never true    [place] in the Last Year: Never true  Transportation Needs: No Transportation Needs (02/11/2021)   PRAPARE - Hydrologist (Medical): No    Lack of Transportation (Non-Medical): No  Physical Activity: Inactive (02/11/2021)   Exercise Vital Sign    Days of Exercise per Week: 0 days    Minutes of Exercise per Session: 0 min  Stress: No Stress Concern Present (02/11/2021)   [place]    Feeling of Stress : Not at all  Social Connections: Moderately Isolated (02/11/2021)   Social Connection and Isolation Panel [NHANES]    Frequency of Communication with Friends and Family: More than three times a week    Frequency of Social Gatherings with Friends and Family: Once a week    Attends Religious Services: Never    Marine scientist or Organizations: No    Attends Music therapist: Never    Marital Status: Married    Review of Systems Per HPI  Objective:  BP 123/78   Pulse 85   Temp 98.2 F (36.8 C)   Wt 174 lb 9.6 oz (79.2 kg)   SpO2 96%   BMI 28.18 kg/m  08/23/2022   11:36 AM 07/05/2022   11:43 AM 07/05/2022   11:42 AM  BP/Weight  Systolic BP 093 818   Diastolic BP 78 87   Wt. (Lbs) 174.6  179  BMI 28.18 kg/m2  28.89 kg/m2    Physical Exam Vitals and nursing note reviewed.  Constitutional:      General: She is not in acute distress.    Appearance: Normal appearance.  HENT:     Head: Normocephalic and atraumatic.     Right Ear: Tympanic membrane normal.     Left Ear: Tympanic membrane normal.     Mouth/Throat:     Pharynx: Posterior oropharyngeal erythema present. No oropharyngeal exudate.  Cardiovascular:     Rate and Rhythm: Normal rate and regular rhythm.  Pulmonary:     Effort: Pulmonary effort is normal.     Breath sounds: Normal breath sounds. No wheezing or rales.   Neurological:     Mental Status: She is alert.     Lab Results  Component Value Date   WBC 18.9 (H) 05/04/2021   HGB 9.9 (L) 05/04/2021   HCT 30.5 (L) 05/04/2021   PLT 333 05/04/2021   GLUCOSE 84 05/03/2021   ALT 8 05/03/2021   AST 15 05/03/2021   NA 134 (L) 05/03/2021   K 4.5 05/03/2021   CL 106 05/03/2021   CREATININE 0.59 05/03/2021   BUN 7 05/03/2021   CO2 22 05/03/2021   TSH 1.950 02/05/2022   HGBA1C 5.1 02/05/2022     Assessment & Plan:   Problem List Items Addressed This Visit       Respiratory   Strep pharyngitis - Primary    Rapid strep positive.  Treating with amoxicillin.      Relevant Medications   amoxicillin (AMOXIL) 500 MG tablet   Other Relevant Orders   POCT rapid strep A (Completed)   Culture, Group A Strep    Meds ordered this encounter  Medications   amoxicillin (AMOXIL) 500 MG tablet    Sig: Take 1 tablet (500 mg total) by mouth 2 (two) times daily.    Dispense:  20 tablet    Refill:  0    Follow-up:  PRN  [place]

## 2022-08-24 ENCOUNTER — Telehealth: Payer: Self-pay

## 2022-08-24 ENCOUNTER — Other Ambulatory Visit: Payer: Self-pay | Admitting: Family Medicine

## 2022-08-24 MED ORDER — ONDANSETRON HCL 4 MG PO TABS: 4.0000 mg | ORAL_TABLET | Freq: Three times a day (TID) | ORAL | 0 refills | Status: DC | PRN

## 2022-08-24 NOTE — Telephone Encounter (Signed)
Caller name: [name]  On DPR?: Yes  Call back number: [phone] (mobile)  Provider they see: Coral Spikes, DO  Reason for call:Pt was seen in office yesterday and she is having nausea and want to see if something can be sent to the pharmacy [facility] in [place]

## 2022-08-24 NOTE — Telephone Encounter (Signed)
Please advise. Thank you

## 2022-08-24 NOTE — Telephone Encounter (Signed)
Mychart message sent to patient.

## 2022-08-25 NOTE — BH Specialist Note (Unsigned)
Pt did not arrive to video visit and did not answer the phone; Unable to leave voicemail as mailbox is full; left MyChart message for patient.

## 2022-08-26 LAB — CULTURE, GROUP A STREP: Strep A Culture: NEGATIVE

## 2022-08-31 ENCOUNTER — Ambulatory Visit: Payer: BC Managed Care – PPO | Admitting: Clinical

## 2022-08-31 DIAGNOSIS — Z91199 Patient's noncompliance with other medical treatment and regimen due to unspecified reason: Secondary | ICD-10-CM

## 2022-09-13 DIAGNOSIS — Z6828 Body mass index (BMI) 28.0-28.9, adult: Secondary | ICD-10-CM | POA: Diagnosis not present

## 2022-09-13 DIAGNOSIS — U071 COVID-19: Secondary | ICD-10-CM | POA: Diagnosis not present

## 2022-09-13 DIAGNOSIS — E663 Overweight: Secondary | ICD-10-CM | POA: Diagnosis not present

## 2022-09-14 ENCOUNTER — Telehealth: Payer: Self-pay

## 2022-09-14 ENCOUNTER — Other Ambulatory Visit: Payer: Self-pay | Admitting: Family Medicine

## 2022-09-14 MED ORDER — MOLNUPIRAVIR EUA 200MG CAPSULE: 4.0000 | ORAL_CAPSULE | Freq: Two times a day (BID) | ORAL | 0 refills | Status: AC

## 2022-09-14 NOTE — Telephone Encounter (Signed)
Patient notified

## 2022-09-14 NOTE — Telephone Encounter (Signed)
Caller name: CHERRILL SCRIMA  On DPR?: Yes  Call back number: [phone] (mobile)  Provider they see: Coral Spikes, DO  Reason for call:Pt tested positive for Covid yesterday at Urgent care and all that was sent to the pharmacy was cough med pt said is not working can something be sent to the pharmacy for her for Covid?   Pt uses [facility] in [place]

## 2022-09-14 NOTE — Telephone Encounter (Signed)
Coral Spikes, DO     I sent in antiviral for her.

## 2022-09-20 ENCOUNTER — Other Ambulatory Visit: Payer: Self-pay | Admitting: *Deleted

## 2022-09-21 ENCOUNTER — Other Ambulatory Visit: Payer: Self-pay | Admitting: *Deleted

## 2022-09-21 MED ORDER — ETONOGESTREL-ETHINYL ESTRADIOL 0.12-0.015 MG/24HR VA RING: VAGINAL_RING | VAGINAL | 3 refills | Status: DC

## 2022-10-10 ENCOUNTER — Encounter: Payer: Self-pay | Admitting: Women's Health

## 2023-02-18 ENCOUNTER — Ambulatory Visit: Payer: BC Managed Care – PPO | Admitting: Nurse Practitioner

## 2023-02-18 ENCOUNTER — Encounter: Payer: Self-pay | Admitting: Nurse Practitioner

## 2023-02-18 VITALS — BP 110/73 | HR 99 | Temp 98.2°F | Ht 67.0 in | Wt 187.0 lb

## 2023-02-18 DIAGNOSIS — R21 Rash and other nonspecific skin eruption: Secondary | ICD-10-CM | POA: Diagnosis not present

## 2023-02-18 DIAGNOSIS — E663 Overweight: Secondary | ICD-10-CM

## 2023-02-18 MED ORDER — CLOBETASOL PROPIONATE 0.05 % EX CREA: TOPICAL_CREAM | CUTANEOUS | 0 refills | Status: DC

## 2023-02-18 MED ORDER — PHENTERMINE-TOPIRAMATE ER 3.75-23 MG PO CP24: ORAL_CAPSULE | ORAL | 0 refills | Status: DC

## 2023-02-18 MED ORDER — PHENTERMINE-TOPIRAMATE ER 7.5-46 MG PO CP24: ORAL_CAPSULE | ORAL | 0 refills | Status: DC

## 2023-02-18 MED ORDER — CLOBETASOL PROPIONATE 0.05 % EX CREA: 1.0000 | TOPICAL_CREAM | Freq: Two times a day (BID) | CUTANEOUS | 0 refills | Status: DC

## 2023-02-20 ENCOUNTER — Encounter: Payer: Self-pay | Admitting: Nurse Practitioner

## 2023-02-20 DIAGNOSIS — E663 Overweight: Secondary | ICD-10-CM | POA: Insufficient documentation

## 2023-02-20 NOTE — Progress Notes (Signed)
Subjective:    Patient ID: Paula Houston, female    DOB: [birth year], 27 y.o.   MRN: [id number]  HPI Presents to discuss weight loss medication choices.  Has been off the phentermine.  Weight had plateaued.  Now feels she is "starving all the time".  Admits to being under a lot more stress lately including buying a home.  Very active lifestyle.  Eats mostly healthy foods.  Has no plans for pregnancy.  Using NuvaRing as her birth control. In addition patient has developed a rash on the hands and fingers for the past 2 to 3 weeks.  Areas on her left fingers are mildly pruritic, areas are nontender.  No known allergens.  No known contacts.  Has not identified any specific triggers.  No other rashes been noted.  No new symptomatology.   Review of Systems  Constitutional:  Positive for fatigue. Negative for fever.  HENT:  Negative for sore throat and trouble swallowing.   Respiratory:  Negative for cough, chest tightness, shortness of breath and wheezing.   Cardiovascular:  Negative for chest pain and palpitations.  Skin:  Positive for rash.      02/18/2023   10:55 AM  Depression screen PHQ 2/9  Decreased Interest 0  Down, Depressed, Hopeless 0  PHQ - 2 Score 0  Altered sleeping 1  Tired, decreased energy 1  Change in appetite 2  Feeling bad or failure about yourself  0  Trouble concentrating 0  Moving slowly or fidgety/restless 0  Suicidal thoughts 0  PHQ-9 Score 4  Difficult doing work/chores Somewhat difficult      02/18/2023   10:56 AM 06/22/2022   10:43 AM 02/11/2021    9:35 AM 10/29/2020    9:34 AM  GAD 7 : Generalized Anxiety Score  Nervous, Anxious, on Edge 0 1 0 1  Control/stop worrying 0 1 0 0  Worry too much - different things 0 1 0 0  Trouble relaxing 0 1 0 0  Restless 0 0 0 0  Easily annoyed or irritable 0 1 0 1  Afraid - awful might happen 0 1 0 0  Total GAD 7 Score 0 6 0 2  Anxiety Difficulty Not difficult at all            Objective:   Physical  Exam NAD.  Alert, oriented.  Thyroid nontender to palpation, no mass or goiter noted.  Lungs clear.  Heart regular rate rhythm.  Well-defined circular hyperpigmented areas with lighter central slightly dry areas noted approximately 1 cm in size on the proximal dorsal aspect of the second and third fingers.  A few irregular faintly hyperpigmented areas noted on the back of the right hand.  No other rash is noted. Today's Vitals   02/18/23 1055  BP: 110/73  Pulse: 99  Temp: 98.2 F (36.8 C)  SpO2: 98%  Weight: 187 lb (84.8 kg)  Height: 5\' 7"  (1.702 m)   Body mass index is 29.29 kg/m.        Assessment & Plan:   Problem List Items Addressed This Visit       Other   Overweight (BMI 25.0-29.9)   Other Visit Diagnoses     Rash and nonspecific skin eruption    -  Primary      Meds ordered this encounter  Medications   Phentermine-Topiramate 3.75-23 MG CP24    Sig: Take one capsule po qd    Dispense:  30 capsule    Refill:  0  Order Specific Question:   Supervising Provider    Answer:   Lilyan Punt A [[id number]]   Phentermine-Topiramate 7.5-46 MG CP24    Sig: Take one cap po qd    Dispense:  30 capsule    Refill:  0    Order Specific Question:   Supervising Provider    Answer:   Lilyan Punt A [[id number]]   DISCONTD: clobetasol cream (TEMOVATE) 0.05 %    Sig: Apply 1 Application topically 2 (two) times daily. Prn rash up to 2 weeks at a time    Dispense:  30 g    Refill:  0    Order Specific Question:   Supervising Provider    Answer:   Lilyan Punt A [[id number]]   clobetasol cream (TEMOVATE) 0.05 %    Sig: Apply up to 0.5 GM topically BID Prn rash up to 2 weeks at a time    Dispense:  30 g    Refill:  0    Order Specific Question:   Supervising Provider    Answer:   Lilyan Punt A [[id number]]   Trial of clobetasol cream to rash twice daily up to 2 weeks at a time.  If rash is not significantly improved or worse or if new symptoms develop, contact the office. Discussed weight  loss options at length.  Patient feels that she cannot afford GLP-1 injections at this time and does not want to do any compounded GLP-1 products. Start Qsymia as directed depending on costs.  Lengthy discussion about risk of birth defects with pregnancy.  Patient has no plans for pregnancy.  To stop medication immediately if she becomes pregnant.  Continue to use NuvaRing as directed. Continue healthy lifestyle including activity and healthy diet.  Discussed the importance of stress reduction.  Patient defers medication at this time. Gets regular preventive health physicals with gynecology. Follow-up here in 3 months if she decides to start Qsymia.

## 2023-02-25 ENCOUNTER — Encounter: Payer: Self-pay | Admitting: Nurse Practitioner

## 2023-04-09 DIAGNOSIS — Z6829 Body mass index (BMI) 29.0-29.9, adult: Secondary | ICD-10-CM | POA: Diagnosis not present

## 2023-04-09 DIAGNOSIS — E663 Overweight: Secondary | ICD-10-CM | POA: Diagnosis not present

## 2023-04-09 DIAGNOSIS — R110 Nausea: Secondary | ICD-10-CM | POA: Diagnosis not present

## 2023-04-09 DIAGNOSIS — B349 Viral infection, unspecified: Secondary | ICD-10-CM | POA: Diagnosis not present

## 2023-04-15 DIAGNOSIS — J029 Acute pharyngitis, unspecified: Secondary | ICD-10-CM | POA: Diagnosis not present

## 2023-06-09 ENCOUNTER — Encounter: Payer: Self-pay | Admitting: Nurse Practitioner

## 2023-06-10 ENCOUNTER — Other Ambulatory Visit: Payer: Self-pay | Admitting: Nurse Practitioner

## 2023-06-10 MED ORDER — QSYMIA 11.25-69 MG PO CP24: ORAL_CAPSULE | ORAL | 0 refills | Status: DC

## 2023-08-20 ENCOUNTER — Other Ambulatory Visit: Payer: Self-pay | Admitting: Women's Health

## 2023-08-23 ENCOUNTER — Other Ambulatory Visit: Payer: Self-pay | Admitting: Nurse Practitioner

## 2023-08-23 ENCOUNTER — Encounter: Payer: Self-pay | Admitting: Women's Health

## 2023-08-24 ENCOUNTER — Encounter: Payer: Self-pay | Admitting: Nurse Practitioner

## 2023-08-24 ENCOUNTER — Other Ambulatory Visit: Payer: Self-pay | Admitting: Nurse Practitioner

## 2023-08-26 DIAGNOSIS — R030 Elevated blood-pressure reading, without diagnosis of hypertension: Secondary | ICD-10-CM | POA: Diagnosis not present

## 2023-08-26 DIAGNOSIS — Z6828 Body mass index (BMI) 28.0-28.9, adult: Secondary | ICD-10-CM | POA: Diagnosis not present

## 2023-08-26 DIAGNOSIS — R059 Cough, unspecified: Secondary | ICD-10-CM | POA: Diagnosis not present

## 2023-08-26 DIAGNOSIS — J209 Acute bronchitis, unspecified: Secondary | ICD-10-CM | POA: Diagnosis not present

## 2023-09-19 ENCOUNTER — Ambulatory Visit: Payer: BC Managed Care – PPO | Admitting: Nurse Practitioner

## 2023-11-01 ENCOUNTER — Ambulatory Visit (INDEPENDENT_AMBULATORY_CARE_PROVIDER_SITE_OTHER): Payer: Self-pay | Admitting: Nurse Practitioner

## 2023-11-01 ENCOUNTER — Other Ambulatory Visit: Payer: Self-pay | Admitting: Nurse Practitioner

## 2023-11-01 ENCOUNTER — Encounter: Payer: Self-pay | Admitting: Nurse Practitioner

## 2023-11-01 VITALS — BP 110/73 | HR 76 | Temp 97.5°F | Ht 67.0 in | Wt 186.0 lb

## 2023-11-01 DIAGNOSIS — J069 Acute upper respiratory infection, unspecified: Secondary | ICD-10-CM

## 2023-11-01 DIAGNOSIS — R053 Chronic cough: Secondary | ICD-10-CM

## 2023-11-01 DIAGNOSIS — E663 Overweight: Secondary | ICD-10-CM

## 2023-11-01 MED ORDER — FLUTICASONE-SALMETEROL 100-50 MCG/ACT IN AEPB: 1.0000 | INHALATION_SPRAY | Freq: Two times a day (BID) | RESPIRATORY_TRACT | 2 refills | Status: DC

## 2023-11-01 MED ORDER — AZITHROMYCIN 250 MG PO TABS: ORAL_TABLET | ORAL | 0 refills | Status: DC

## 2023-11-01 MED ORDER — PHENTERMINE-TOPIRAMATE ER 15-92 MG PO CP24: ORAL_CAPSULE | ORAL | 2 refills | Status: DC

## 2023-11-01 MED ORDER — PSEUDOEPH-BROMPHEN-DM 30-2-10 MG/5ML PO SYRP: 5.0000 mL | ORAL_SOLUTION | Freq: Four times a day (QID) | ORAL | 0 refills | Status: DC | PRN

## 2023-11-01 NOTE — Progress Notes (Signed)
Subjective:    Patient ID: Paula Houston, female    DOB: 31-Jan-1996, 28 y.o.   MRN: [id number]  HPI Presents for routine follow-up for weight was on Qsymia at 11.25/69.  Has been off this for at least a month.  Would like to restart, preferably at the higher dose.  States her weight at home was 176 pounds the last time she checked.  The medication does help her appetite and satiety.  Very active lifestyle including walking.  Also decrease soda intake while she is on the Qsymia.  Denies any adverse effects.  Currently on NuvaRing for birth control which is used consistently. Also complaints of cough and congestion that began around 2 years.  Sore throat mainly in the evenings.  Cough is worse at night.  Chest pain with prolonged cough.  No wheezing.  No ear pain.  No relief with OTC cough medicines.  Denies any smoking or vaping.  Denies any acid reflux or abdominal pain.   Review of Systems  Constitutional:  Negative for fever.  HENT:  Positive for congestion, postnasal drip and sore throat. Negative for ear pain and sinus pain.   Respiratory:  Positive for cough. Negative for chest tightness, shortness of breath and wheezing.   Cardiovascular:  Positive for chest pain.       Chest pain with prolonged cough.  Psychiatric/Behavioral:  Positive for sleep disturbance. Negative for suicidal ideas. The patient is not nervous/anxious.       11/01/2023    9:54 AM  Depression screen PHQ 2/9  Decreased Interest 0  Down, Depressed, Hopeless 0  PHQ - 2 Score 0  Altered sleeping 2  Tired, decreased energy 1  Change in appetite 2  Feeling bad or failure about yourself  0  Trouble concentrating 0  Moving slowly or fidgety/restless 0  Suicidal thoughts 0  PHQ-9 Score 5  Difficult doing work/chores Somewhat difficult      11/01/2023    9:55 AM 02/18/2023   10:56 AM 06/22/2022   10:43 AM 02/11/2021    9:35 AM  GAD 7 : Generalized Anxiety Score  Nervous, Anxious, on Edge 0 0 1 0   Control/stop worrying 0 0 1 0  Worry too much - different things 0 0 1 0  Trouble relaxing 0 0 1 0  Restless 0 0 0 0  Easily annoyed or irritable 0 0 1 0  Afraid - awful might happen 0 0 1 0  Total GAD 7 Score 0 0 6 0  Anxiety Difficulty Somewhat difficult Not difficult at all          Objective:   Physical Exam NAD.  Alert, oriented.  TMs mild clear effusion, no erythema.  Pharynx moderately injected.  Neck supple with mild soft nontender adenopathy.  Lungs clear.  Heart regular rate rhythm.  Occasional nonproductive cough noted.  Frequent clearing of the throat. Today's Vitals   11/01/23 0949  BP: 110/73  Pulse: 76  Temp: (!) 97.5 F (36.4 C)  SpO2: 98%  Weight: 186 lb (84.4 kg)  Height: 5\' 7"  (1.702 m)   Body mass index is 29.13 kg/m.        Assessment & Plan:   Problem List Items Addressed This Visit       Other   Overweight (BMI 25.0-29.9)   Other Visit Diagnoses       URI with cough and congestion    -  Primary   Relevant Medications   azithromycin (ZITHROMAX Z-PAK) 250  MG tablet     Persistent cough for 3 weeks or longer          Meds ordered this encounter  Medications   DISCONTD: Phentermine-Topiramate 15-92 MG CP24    Sig: Take one capsule po q am for weight loss    Dispense:  30 capsule    Refill:  2    Supervising Provider:   Lilyan Punt A [[id number]]   azithromycin (ZITHROMAX Z-PAK) 250 MG tablet    Sig: Take 2 tablets (500 mg) on  Day 1,  followed by 1 tablet (250 mg) once daily on Days 2 through 5.    Dispense:  6 each    Refill:  0    Supervising Provider:   Lilyan Punt A [[id number]]   DISCONTD: fluticasone-salmeterol (ADVAIR) 100-50 MCG/ACT AEPB    Sig: Inhale 1 puff into the lungs 2 (two) times daily.    Dispense:  1 each    Refill:  2    Patient will be using GoodRx coupon; uninsured    Supervising Provider:   Lilyan Punt A [[id number]]   brompheniramine-pseudoephedrine-DM 30-2-10 MG/5ML syrup    Sig: Take 5 mLs by mouth 4 (four) times  daily as needed. For cough and congestion    Dispense:  120 mL    Refill:  0    Supervising Provider:   Lilyan Punt A [[id number]]   fluticasone-salmeterol (ADVAIR) 100-50 MCG/ACT AEPB    Sig: Inhale 1 puff into the lungs 2 (two) times daily.    Dispense:  1 each    Refill:  2    Patient will be using GoodRx coupon; uninsured    Supervising Provider:   Lilyan Punt A [[id number]]   Start Z-Pak as directed.  Advair as directed.  Patient states that Bromfed-DM syrup seem to help cough so refill given. Increase Qsymia to 15/92 mg daily.  Again cautioned about avoiding pregnancy due to birth defect risk with topiramate.  Will try for 3 more months and see how patient progresses. Return in about 3 months (around 01/30/2024).

## 2023-11-10 ENCOUNTER — Ambulatory Visit: Payer: Self-pay | Admitting: Women's Health

## 2023-12-08 ENCOUNTER — Ambulatory Visit (INDEPENDENT_AMBULATORY_CARE_PROVIDER_SITE_OTHER): Payer: Self-pay | Admitting: Women's Health

## 2023-12-08 ENCOUNTER — Other Ambulatory Visit (HOSPITAL_COMMUNITY)
Admission: RE | Admit: 2023-12-08 | Discharge: 2023-12-08 | Disposition: A | Discharge status: Home / Self Care | Payer: Self-pay | Source: Ambulatory Visit | Attending: Women's Health | Admitting: Women's Health

## 2023-12-08 ENCOUNTER — Encounter: Payer: Self-pay | Admitting: Women's Health

## 2023-12-08 VITALS — BP 114/76 | HR 92 | Ht 66.0 in | Wt 178.0 lb

## 2023-12-08 DIAGNOSIS — Z01419 Encounter for gynecological examination (general) (routine) without abnormal findings: Secondary | ICD-10-CM | POA: Insufficient documentation

## 2023-12-08 DIAGNOSIS — Z30015 Encounter for initial prescription of vaginal ring hormonal contraceptive: Secondary | ICD-10-CM

## 2023-12-08 MED ORDER — ANNOVERA 0.013-0.15 MG/24HR VA RING: VAGINAL_RING | VAGINAL | 0 refills | Status: AC

## 2023-12-08 NOTE — Addendum Note (Signed)
 Addended by: Moss Mc on: 12/08/2023 11:46 AM   Modules accepted: Orders

## 2023-12-08 NOTE — Progress Notes (Addendum)
 WELL-WOMAN EXAMINATION Patient name: Paula Houston MRN [id number]  Date of birth: 1996/06/24 Chief Complaint:   Annual Exam  History of Present Illness:   Paula Houston is a 28 y.o. G72P3003 Caucasian female being seen today for a routine well-woman exam.  Current complaints: none  PCP: [facility]      does not desire labs Patient's last menstrual period was 11/14/2023. The current method of family planning is NuvaRing vaginal inserts, discussed Annovera, wants to try Last pap 10/29/20. Results were: NILM w/ HRHPV not done. H/O abnormal pap: no Last mammogram: never. Results were: N/A. Family h/o breast cancer: yes great aunt in 38s Last colonoscopy: never. Results were: N/A. Family h/o colorectal cancer: no     12/08/2023   10:42 AM 11/01/2023    9:54 AM 02/18/2023   10:55 AM 06/22/2022   10:42 AM 08/18/2021    2:46 PM  Depression screen PHQ 2/9  Decreased Interest 0 0 0 1 0  Down, Depressed, Hopeless 0 0 0 1 0  PHQ - 2 Score 0 0 0 2 0  Altered sleeping 0 2 1 1    Tired, decreased energy 0 1 1 1    Change in appetite 0 2 2 1    Feeling bad or failure about yourself  0 0 0 1   Trouble concentrating 0 0 0 0   Moving slowly or fidgety/restless 0 0 0 0   Suicidal thoughts 0 0 0 0   PHQ-9 Score 0 5 4 6    Difficult doing work/chores  Somewhat difficult Somewhat difficult          11/01/2023    9:55 AM 02/18/2023   10:56 AM 06/22/2022   10:43 AM 02/11/2021    9:35 AM  GAD 7 : Generalized Anxiety Score  Nervous, Anxious, on Edge 0 0 1 0  Control/stop worrying 0 0 1 0  Worry too much - different things 0 0 1 0  Trouble relaxing 0 0 1 0  Restless 0 0 0 0  Easily annoyed or irritable 0 0 1 0  Afraid - awful might happen 0 0 1 0  Total GAD 7 Score 0 0 6 0  Anxiety Difficulty Somewhat difficult Not difficult at all       Review of Systems:   Pertinent items are noted in HPI Denies any headaches, blurred vision, fatigue, shortness of breath, chest pain, abdominal pain, abnormal  vaginal discharge/itching/odor/irritation, problems with periods, bowel movements, urination, or intercourse unless otherwise stated above. Pertinent History Reviewed:  Reviewed past medical,surgical, social and family history.  Reviewed problem list, medications and allergies. Physical Assessment:   Vitals:   12/08/23 1040  BP: 114/76  Pulse: 92  Weight: 178 lb (80.7 kg)  Height: 5\' 6"  (1.676 m)  Body mass index is 28.73 kg/m.        Physical Examination:   General appearance - well appearing, and in no distress  Mental status - alert, oriented to person, place, and time  Psych:  She has a normal mood and affect  Skin - warm and dry, normal color, no suspicious lesions noted  Chest - effort normal, all lung fields clear to auscultation bilaterally  Heart - normal rate and regular rhythm  Neck:  midline trachea, no thyromegaly or nodules  Breasts - breasts appear normal, no suspicious masses, no skin or nipple changes or  axillary nodes  Abdomen - soft, nontender, nondistended, no masses or organomegaly  Pelvic - VULVA: normal appearing vulva with no  masses, tenderness or lesions  VAGINA: normal appearing vagina with normal color and discharge, no lesions  CERVIX: normal appearing cervix without discharge or lesions, no CMT  Thin prep pap is done w/ HR HPV cotesting  UTERUS: uterus is felt to be normal size, shape, consistency and nontender   ADNEXA: No adnexal masses or tenderness noted.  Extremities:  No swelling or varicosities noted  Chaperone: Latisha Cresenzo  No results found for this or any previous visit (from the past 24 hours).  Assessment & Plan:  1) Well-Woman Exam  2) Contraception management> doing well on NuvaRing, discussed Annovera, wants to try, is self-pay, currently paying 100+ q70mths, cash pay price for Annovera 365, so would save her some money. Rx sent to [facility]  Labs/procedures today: pap  Mammogram: @ 28yo, or sooner if problems Colonoscopy: @ 28yo,  or sooner if problems  No orders of the defined types were placed in this encounter.   Meds:  Meds ordered this encounter  Medications   Segesterone-Ethinyl Estradiol (ANNOVERA) 0.15-0.013 MG/24HR RING    Sig: Insert vaginally x 3 weeks, remove for 1 week; repeat monthly x 75yr    Dispense:  1 each    Refill:  0    Follow-up: No follow-ups on file.  Cheral Marker CNM, [facility] 12/08/2023 11:22 AM

## 2023-12-13 ENCOUNTER — Encounter: Payer: Self-pay | Admitting: Women's Health

## 2023-12-13 LAB — CYTOLOGY - PAP
Comment: NEGATIVE
Diagnosis: NEGATIVE
High risk HPV: NEGATIVE

## 2024-02-29 ENCOUNTER — Ambulatory Visit: Payer: Self-pay | Admitting: Family Medicine

## 2024-02-29 ENCOUNTER — Encounter: Payer: Self-pay | Admitting: Family Medicine

## 2024-02-29 VITALS — BP 108/71 | HR 88 | Temp 98.0°F | Ht 66.0 in | Wt 176.0 lb

## 2024-02-29 DIAGNOSIS — H579 Unspecified disorder of eye and adnexa: Secondary | ICD-10-CM

## 2024-02-29 NOTE — Patient Instructions (Signed)
 Appears benign.  If worsens, recommend seeing [facility] in [place].

## 2024-03-01 DIAGNOSIS — H579 Unspecified disorder of eye and adnexa: Secondary | ICD-10-CM | POA: Insufficient documentation

## 2024-03-01 NOTE — Progress Notes (Signed)
 Subjective:  Patient ID: Paula Houston, female    DOB: April 01, 1996  Age: 28 y.o. MRN: [id number]  CC:   Chief Complaint  Patient presents with   Eye Problem    Bloodshot last week. Bump in eye, watery and itchy     HPI:  28 year old female presents for evaluation of the above.  Patient reports that on Sunday she developed eye redness (L). Associated itchy and mild swelling. This has improved but now she has a small bump. She is concerned about this. No significant pain. No current discharge.  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/2021   History of gestational diabetes 06/26/2021   Migraine without aura and without status migrainosus, not intractable 06/26/2021   PCOS (polycystic ovarian syndrome) 06/08/2016    Social Hx   Social History   Socioeconomic History   Marital status: Married    Spouse name: Bearl Limes   Number of children: 3   Years of education: Not on file   Highest education level: Not on file  Occupational History   Not on file  Tobacco Use   Smoking status: Never   Smokeless tobacco: Never  Vaping Use   Vaping status: Never Used  Substance and Sexual Activity   Alcohol use: Yes    Comment: Occas   Drug use: No   Sexual activity: Yes    Birth control/protection: Inserts  Other Topics Concern   Not on file  Social History Narrative   Not on file   Social Drivers of Health   Financial Resource Strain: Low Risk  (12/08/2023)   Overall Financial Resource Strain (CARDIA)    Difficulty of Paying Living Expenses: Not hard at all  Food Insecurity: No Food Insecurity (12/08/2023)   Hunger Vital Sign    Worried About Running Out of Food in the Last Year: Never true    Ran Out of Food in the Last Year: Never true  Transportation Needs: No Transportation Needs (12/08/2023)    PRAPARE - Administrator, Civil Service (Medical): No    Lack of Transportation (Non-Medical): No  Physical Activity: Insufficiently Active (12/08/2023)   Exercise Vital Sign    Days of Exercise per Week: 3 days    Minutes of Exercise per Session: 10 min  Stress: No Stress Concern Present (12/08/2023)   [employer] of Occupational Health - Occupational Stress Questionnaire    Feeling of Stress : Not at all  Social Connections: Moderately Integrated (12/08/2023)   Social Connection and Isolation Panel [NHANES]    Frequency of Communication with Friends and Family: Three times a week    Frequency of Social Gatherings with Friends and Family: Once a week    Attends Religious Services: Never    Database administrator or Organizations: Yes    Attends Engineer, structural: More than 4 times per year    Marital Status: Married    Review of Systems Per HPI  Objective:  BP 108/71   Pulse 88   Temp 98 F (36.7 C)   Ht 5\' 6"  (1.676 m)   Wt 176 lb (79.8 kg)   SpO2 98%   BMI 28.41 kg/m      02/29/2024    3:06 PM 12/08/2023   10:40 AM 11/01/2023    9:49 AM  BP/Weight  Systolic BP 108 114 110  Diastolic BP 71 76 73  Wt. (Lbs) 176 178 186  BMI 28.41 kg/m2 28.73 kg/m2 29.13 kg/m2    Physical Exam Vitals and nursing note reviewed.  Constitutional:      General: She is not in acute distress.    Appearance: Normal appearance.  HENT:     Head: Normocephalic and atraumatic.  Eyes:      Comments: Small raised area at the labeled location.  Neurological:     Mental Status: She is alert.    Lab Results  Component Value Date   WBC 18.9 (H) 05/04/2021   HGB 9.9 (L) 05/04/2021   HCT 30.5 (L) 05/04/2021   PLT 333 05/04/2021   GLUCOSE 84 05/03/2021   ALT 8 05/03/2021   AST 15 05/03/2021   NA 134 (L) 05/03/2021   K 4.5 05/03/2021   CL 106 05/03/2021   CREATININE 0.59 05/03/2021   BUN 7 05/03/2021   CO2 22 05/03/2021   TSH 1.950 02/05/2022   HGBA1C 5.1  02/05/2022     Assessment & Plan:  Lesion of eye Assessment & Plan: Benign. Supportive care.    Follow-up:  Return if symptoms worsen or fail to improve.  Kathleen Papa DO [facility]

## 2024-03-01 NOTE — Assessment & Plan Note (Signed)
Benign.  Supportive care. 

## 2024-04-18 ENCOUNTER — Encounter: Payer: Self-pay | Admitting: Nurse Practitioner

## 2024-04-20 ENCOUNTER — Other Ambulatory Visit: Payer: Self-pay | Admitting: Nurse Practitioner

## 2024-04-20 MED ORDER — PHENTERMINE-TOPIRAMATE ER 15-92 MG PO CP24: ORAL_CAPSULE | ORAL | 0 refills | Status: DC

## 2024-04-23 ENCOUNTER — Other Ambulatory Visit: Payer: Self-pay | Admitting: Nurse Practitioner

## 2024-04-23 MED ORDER — PHENTERMINE-TOPIRAMATE ER 15-92 MG PO CP24: ORAL_CAPSULE | ORAL | 0 refills | Status: DC

## 2024-05-01 ENCOUNTER — Encounter: Payer: Self-pay | Admitting: Nurse Practitioner

## 2024-05-01 ENCOUNTER — Ambulatory Visit (INDEPENDENT_AMBULATORY_CARE_PROVIDER_SITE_OTHER): Payer: Self-pay | Admitting: Nurse Practitioner

## 2024-05-01 VITALS — BP 123/84 | HR 85 | Temp 97.1°F | Ht 66.0 in | Wt 174.0 lb

## 2024-05-01 DIAGNOSIS — E663 Overweight: Secondary | ICD-10-CM

## 2024-05-01 DIAGNOSIS — E282 Polycystic ovarian syndrome: Secondary | ICD-10-CM

## 2024-05-01 DIAGNOSIS — R5383 Other fatigue: Secondary | ICD-10-CM

## 2024-05-01 MED ORDER — PHENTERMINE-TOPIRAMATE ER 15-92 MG PO CP24: ORAL_CAPSULE | ORAL | 2 refills | Status: DC

## 2024-05-03 ENCOUNTER — Encounter: Payer: Self-pay | Admitting: Nurse Practitioner

## 2024-05-03 NOTE — Progress Notes (Signed)
 Subjective:    Patient ID: Paula Houston, female    DOB: 1996-09-13, 28 y.o.   MRN: [id number]  HPI Presents for routine follow-up for Qsymia  use.  Patient's weight this morning at home was 168.  States she is retaining quite a bit of fluid due to her cycle which is getting ready to start.  Has been doing well with her diet and activity.  Denies any adverse effects from the Qsymia .  Is also due for her routine lab work.   Review of Systems  Constitutional:  Positive for fatigue.  HENT:  Negative for sore throat and trouble swallowing.   Respiratory:  Negative for cough, chest tightness and shortness of breath.   Cardiovascular:  Negative for chest pain, palpitations and leg swelling.      05/01/2024    1:50 PM  Depression screen PHQ 2/9  Decreased Interest 0  Down, Depressed, Hopeless 0  PHQ - 2 Score 0  Altered sleeping 1  Tired, decreased energy 0  Change in appetite 0  Feeling bad or failure about yourself  0  Trouble concentrating 1  Moving slowly or fidgety/restless 0  Suicidal thoughts 0  PHQ-9 Score 2  Difficult doing work/chores Not difficult at all      05/01/2024    1:50 PM 02/29/2024    3:12 PM 11/01/2023    9:55 AM 02/18/2023   10:56 AM  GAD 7 : Generalized Anxiety Score  Nervous, Anxious, on Edge 0 0 0 0  Control/stop worrying 0 0 0 0  Worry too much - different things 0 0 0 0  Trouble relaxing 0 0 0 0  Restless 0  0 0  Easily annoyed or irritable 1 1 0 0  Afraid - awful might happen 0 0 0 0  Total GAD 7 Score 1  0 0  Anxiety Difficulty Not difficult at all Not difficult at all Somewhat difficult Not difficult at all    Social History   Tobacco Use   Smoking status: Never   Smokeless tobacco: Never  Vaping Use   Vaping status: Never Used  Substance Use Topics   Alcohol use: Yes    Comment: Occas   Drug use: No        Objective:   Physical Exam Vitals and nursing note reviewed.  Neck:     Comments: Thyroid  nontender to palpation, no  mass or goiter noted. Cardiovascular:     Rate and Rhythm: Normal rate and regular rhythm.     Heart sounds: Normal heart sounds.  Pulmonary:     Effort: Pulmonary effort is normal.     Breath sounds: Normal breath sounds.  Neurological:     Mental Status: She is oriented to person, place, and time.  Psychiatric:        Mood and Affect: Mood normal.        Behavior: Behavior normal.        Thought Content: Thought content normal.        Judgment: Judgment normal.    Today's Vitals   05/01/24 1345  BP: 123/84  Pulse: 85  Temp: (!) 97.1 F (36.2 C)  SpO2: 98%  Weight: 174 lb (78.9 kg)  Height: 5' 6 (1.676 m)   Body mass index is 28.08 kg/m.         Assessment & Plan:   Problem List Items Addressed This Visit       Endocrine   PCOS (polycystic ovarian syndrome) - Primary   Relevant  Orders   Comprehensive metabolic panel with GFR   Hemoglobin A1c   Lipid panel     Other   Overweight (BMI 25.0-29.9)   Other Visit Diagnoses       Fatigue, unspecified type       Relevant Orders   CBC with Differential/Platelet   TSH      Lab work ordered. Meds ordered this encounter  Medications   Phentermine -Topiramate  ER 15-92 MG CP24    Sig: Take one capsule po q am for weight loss    Dispense:  30 capsule    Refill:  2    Supervising Provider:   ALPHONSA HAMILTON A [[id number]]   Continue Qsymia  as directed.  Encourage continued healthy diet and regular activity.  Note the patient has no plans for another pregnancy and is on regular contraception. Return in about 3 months (around 08/01/2024).

## 2024-07-25 ENCOUNTER — Encounter: Payer: Self-pay | Admitting: Nurse Practitioner

## 2024-08-02 ENCOUNTER — Ambulatory Visit (INDEPENDENT_AMBULATORY_CARE_PROVIDER_SITE_OTHER): Payer: Self-pay | Admitting: Nurse Practitioner

## 2024-08-02 VITALS — BP 119/83 | HR 96 | Temp 98.1°F | Ht 66.0 in | Wt 173.0 lb

## 2024-08-02 DIAGNOSIS — E663 Overweight: Secondary | ICD-10-CM

## 2024-08-02 DIAGNOSIS — E282 Polycystic ovarian syndrome: Secondary | ICD-10-CM

## 2024-08-02 DIAGNOSIS — Z634 Disappearance and death of family member: Secondary | ICD-10-CM

## 2024-08-02 MED ORDER — PHENTERMINE-TOPIRAMATE ER 15-92 MG PO CP24: ORAL_CAPSULE | ORAL | 2 refills | Status: AC

## 2024-08-03 ENCOUNTER — Encounter: Payer: Self-pay | Admitting: Nurse Practitioner

## 2024-08-03 LAB — LIPID PANEL
Chol/HDL Ratio: 3.6 ratio (ref 0.0–4.4)
Cholesterol, Total: 175 mg/dL (ref 100–199)
HDL: 48 mg/dL (ref >39)
LDL Chol Calc (NIH): 100 mg/dL — ABNORMAL HIGH (ref 0–99)
Triglycerides: 154 mg/dL — ABNORMAL HIGH (ref 0–149)
VLDL Cholesterol Cal: 27 mg/dL (ref 5–40)

## 2024-08-03 LAB — TSH: TSH: 1.11 u[IU]/mL (ref 0.450–4.500)

## 2024-08-03 LAB — COMPREHENSIVE METABOLIC PANEL WITH GFR
ALT: 7 IU/L (ref 0–32)
AST: 9 IU/L (ref 0–40)
Albumin: 4.2 g/dL (ref 4.0–5.0)
Alkaline Phosphatase: 78 IU/L (ref 41–116)
BUN/Creatinine Ratio: 13 (ref 9–23)
BUN: 8 mg/dL (ref 6–20)
Bilirubin Total: <0.2 mg/dL (ref 0.0–1.2)
CO2: 22 mmol/L (ref 20–29)
Calcium: 9.6 mg/dL (ref 8.7–10.2)
Chloride: 104 mmol/L (ref 96–106)
Creatinine, Ser: 0.63 mg/dL (ref 0.57–1.00)
Globulin, Total: 3.2 g/dL (ref 1.5–4.5)
Glucose: 89 mg/dL (ref 70–99)
Potassium: 5 mmol/L (ref 3.5–5.2)
Sodium: 139 mmol/L (ref 134–144)
Total Protein: 7.4 g/dL (ref 6.0–8.5)
eGFR: 124 mL/min/1.73 (ref >59)

## 2024-08-03 LAB — CBC WITH DIFFERENTIAL/PLATELET
Basophils Absolute: 0.1 x10E3/uL (ref 0.0–0.2)
Basos: 1 %
EOS (ABSOLUTE): 0.2 x10E3/uL (ref 0.0–0.4)
Eos: 1 %
Hematocrit: 41.9 % (ref 34.0–46.6)
Hemoglobin: 13.8 g/dL (ref 11.1–15.9)
Immature Grans (Abs): 0 x10E3/uL (ref 0.0–0.1)
Immature Granulocytes: 0 %
Lymphocytes Absolute: 3.1 x10E3/uL (ref 0.7–3.1)
Lymphs: 27 %
MCH: 30.5 pg (ref 26.6–33.0)
MCHC: 32.9 g/dL (ref 31.5–35.7)
MCV: 93 fL (ref 79–97)
Monocytes Absolute: 0.9 x10E3/uL (ref 0.1–0.9)
Monocytes: 8 %
Neutrophils Absolute: 7.5 x10E3/uL — ABNORMAL HIGH (ref 1.4–7.0)
Neutrophils: 63 %
Platelets: 646 x10E3/uL — ABNORMAL HIGH (ref 150–450)
RBC: 4.53 x10E6/uL (ref 3.77–5.28)
RDW: 12.3 % (ref 11.7–15.4)
WBC: 11.8 x10E3/uL — ABNORMAL HIGH (ref 3.4–10.8)

## 2024-08-03 LAB — HEMOGLOBIN A1C
Est. average glucose Bld gHb Est-mCnc: 91 mg/dL
Hgb A1c MFr Bld: 4.8 % (ref 4.8–5.6)

## 2024-08-03 NOTE — Progress Notes (Signed)
 Subjective:    Patient ID: Paula Houston, female    DOB: 1995/10/22, 28 y.o.   MRN: [id number]  HPI Discussed the use of AI scribe software for clinical note transcription with the patient, who gave verbal consent to proceed.  History of Present Illness Paula Houston is a 28 year old female with PCOS who presents for a recheck and management of weight maintenance on Qsymia .  She is on the maximum dose of Qsymia . She reports that when not taking the medication, she experiences increased hunger and feels like she is starving. Tries to take it several days a week, not every day, it helps her avoid overeating. Her weight has decreased from 187 pounds in May to 173 pounds currently, with a BMI of 27. Tries to eat healthy and control portion sizes. Staying active. Gets physicals with GYN.  She has a history of polycystic ovary syndrome (PCOS) and experiences significant facial swelling when not taking Qsymia , which she attributes to PCOS. She has noticed hair loss, which she suspects might be related to the medication, and has started using a new shampoo to address this issue.  No chest pain, shortness of breath, or rapid heart rate, but she reports trouble sleeping, which she attributes to recent stress and anxiety. She reports recent significant stress due to the passing of her mother in August, contributing to her trouble sleeping and increased anxiety. She acknowledges feeling depressed and attributes these feelings to her recent bereavement. She has a support system but is uncertain about seeking bereavement counseling.     08/02/2024    1:09 PM  Depression screen PHQ 2/9  Decreased Interest 1  Down, Depressed, Hopeless 1  PHQ - 2 Score 2  Altered sleeping 1  Tired, decreased energy 1  Change in appetite 0  Feeling bad or failure about yourself  0  Trouble concentrating 1  Moving slowly or fidgety/restless 0  Suicidal thoughts 0  PHQ-9 Score 5  Difficult doing work/chores  Somewhat difficult      08/02/2024    1:10 PM 05/01/2024    1:50 PM 02/29/2024    3:12 PM 11/01/2023    9:55 AM  GAD 7 : Generalized Anxiety Score  Nervous, Anxious, on Edge 1 0 0 0  Control/stop worrying 1 0 0 0  Worry too much - different things 0 0 0 0  Trouble relaxing 0 0 0 0  Restless 0 0  0  Easily annoyed or irritable 1 1 1  0  Afraid - awful might happen 1 0 0 0  Total GAD 7 Score 4 1  0  Anxiety Difficulty Somewhat difficult Not difficult at all Not difficult at all Somewhat difficult    Social History   Tobacco Use   Smoking status: Never   Smokeless tobacco: Never  Vaping Use   Vaping status: Never Used  Substance Use Topics   Alcohol use: Yes    Comment: Occas   Drug use: No        Objective:   Physical Exam Vitals reviewed.  Constitutional:      General: She is not in acute distress. Cardiovascular:     Rate and Rhythm: Normal rate and regular rhythm.  Pulmonary:     Effort: Pulmonary effort is normal.     Breath sounds: Normal breath sounds.  Neurological:     Mental Status: She is alert and oriented to person, place, and time.  Psychiatric:        Mood and  Affect: Mood normal.        Behavior: Behavior normal.        Thought Content: Thought content normal.        Judgment: Judgment normal.    Today's Vitals   08/02/24 1307  BP: 119/83  Pulse: 96  Temp: 98.1 F (36.7 C)  SpO2: 96%  Weight: 173 lb (78.5 kg)  Height: 5' 6 (1.676 m)   Body mass index is 27.92 kg/m.        Assessment & Plan:  1. PCOS (polycystic ovarian syndrome) (Primary) Overweight and weight management in the setting of Polycystic Ovarian Syndrome (PCOS) Qsymia  effectively maintains weight and reduces hunger, with a decrease from 187 lbs to 173 lbs. BMI is 27. Medication reduces PCOS symptoms. Primary goal is diabetes prevention. No pregnancy plans due to Qsymia 's category X status. - Continue Qsymia . - Discussed Saxenda as future alternative. - Monitor weight  and symptoms, consider alternatives if ineffective. - Phentermine -Topiramate  ER 15-92 MG CP24; Take one capsule po q am for weight loss  Dispense: 30 capsule; Refill: 2  2. Overweight (BMI 25.0-29.9)  - Phentermine -Topiramate  ER 15-92 MG CP24; Take one capsule po q am for weight loss  Dispense: 30 capsule; Refill: 2  3. Bereavement Experiencing bereavement with increased anxiety and sleep difficulty. Has support system, unsure about counseling. - Offer bereavement counseling if desired. - Encourage use of support system, allow time for grieving.  Will get labs done today.   Return for Recheck if any further refills on medication.

## 2024-08-05 ENCOUNTER — Ambulatory Visit: Payer: Self-pay | Admitting: Nurse Practitioner

## 2024-08-07 ENCOUNTER — Other Ambulatory Visit: Payer: Self-pay | Admitting: Nurse Practitioner

## 2024-08-07 DIAGNOSIS — D72829 Elevated white blood cell count, unspecified: Secondary | ICD-10-CM

## 2024-08-07 DIAGNOSIS — D75839 Thrombocytosis, unspecified: Secondary | ICD-10-CM

## 2024-09-14 ENCOUNTER — Inpatient Hospital Stay: Payer: Self-pay | Attending: Oncology | Admitting: Oncology

## 2024-09-14 ENCOUNTER — Encounter: Payer: Self-pay | Admitting: Oncology

## 2024-09-14 ENCOUNTER — Inpatient Hospital Stay: Payer: Self-pay

## 2024-09-14 DIAGNOSIS — M2550 Pain in unspecified joint: Secondary | ICD-10-CM

## 2024-09-14 DIAGNOSIS — E282 Polycystic ovarian syndrome: Secondary | ICD-10-CM | POA: Insufficient documentation

## 2024-09-14 DIAGNOSIS — D649 Anemia, unspecified: Secondary | ICD-10-CM | POA: Insufficient documentation

## 2024-09-14 DIAGNOSIS — D72829 Elevated white blood cell count, unspecified: Secondary | ICD-10-CM | POA: Insufficient documentation

## 2024-09-14 DIAGNOSIS — D75839 Thrombocytosis, unspecified: Secondary | ICD-10-CM | POA: Insufficient documentation

## 2024-09-14 DIAGNOSIS — R519 Headache, unspecified: Secondary | ICD-10-CM | POA: Insufficient documentation

## 2024-09-14 DIAGNOSIS — R5383 Other fatigue: Secondary | ICD-10-CM

## 2024-09-14 DIAGNOSIS — K649 Unspecified hemorrhoids: Secondary | ICD-10-CM | POA: Insufficient documentation

## 2024-09-14 LAB — COMPREHENSIVE METABOLIC PANEL WITH GFR
ALT: 8 U/L (ref 0–44)
AST: 12 U/L — ABNORMAL LOW (ref 15–41)
Albumin: 4.2 g/dL (ref 3.5–5.0)
Alkaline Phosphatase: 66 U/L (ref 38–126)
Anion gap: 14 (ref 5–15)
BUN: 8 mg/dL (ref 6–20)
CO2: 21 mmol/L — ABNORMAL LOW (ref 22–32)
Calcium: 9.2 mg/dL (ref 8.9–10.3)
Chloride: 105 mmol/L (ref 98–111)
Creatinine, Ser: 0.58 mg/dL (ref 0.44–1.00)
GFR, Estimated: >60 mL/min (ref >60)
Glucose, Bld: 83 mg/dL (ref 70–99)
Potassium: 3.8 mmol/L (ref 3.5–5.1)
Sodium: 140 mmol/L (ref 135–145)
Total Bilirubin: 0.2 mg/dL (ref 0.0–1.2)
Total Protein: 7.4 g/dL (ref 6.5–8.1)

## 2024-09-14 LAB — CBC WITH DIFFERENTIAL/PLATELET
Abs Immature Granulocytes: 0.08 K/uL — ABNORMAL HIGH (ref 0.00–0.07)
Basophils Absolute: 0.1 K/uL (ref 0.0–0.1)
Basophils Relative: 1 %
Eosinophils Absolute: 0.2 K/uL (ref 0.0–0.5)
Eosinophils Relative: 2 %
HCT: 38.5 % (ref 36.0–46.0)
Hemoglobin: 12.9 g/dL (ref 12.0–15.0)
Immature Granulocytes: 1 %
Lymphocytes Relative: 35 %
Lymphs Abs: 3.6 K/uL (ref 0.7–4.0)
MCH: 30 pg (ref 26.0–34.0)
MCHC: 33.5 g/dL (ref 30.0–36.0)
MCV: 89.5 fL (ref 80.0–100.0)
Monocytes Absolute: 0.8 K/uL (ref 0.1–1.0)
Monocytes Relative: 8 %
Neutro Abs: 5.6 K/uL (ref 1.7–7.7)
Neutrophils Relative %: 53 %
Platelets: 584 K/uL — ABNORMAL HIGH (ref 150–400)
RBC: 4.3 MIL/uL (ref 3.87–5.11)
RDW: 13.1 % (ref 11.5–15.5)
WBC: 10.3 K/uL (ref 4.0–10.5)
nRBC: 0 % (ref 0.0–0.2)

## 2024-09-14 LAB — SEDIMENTATION RATE: Sed Rate: 7 mm/h (ref 0–20)

## 2024-09-14 LAB — URIC ACID: Uric Acid, Serum: 4.4 mg/dL (ref 2.5–7.1)

## 2024-09-14 LAB — IRON AND TIBC
Iron: 53 ug/dL (ref 28–170)
Saturation Ratios: 14 % (ref 10.4–31.8)
TIBC: 374 ug/dL (ref 250–450)
UIBC: 321 ug/dL

## 2024-09-14 LAB — C-REACTIVE PROTEIN: CRP: 0.8 mg/dL (ref <1.0)

## 2024-09-14 LAB — TECHNOLOGIST SMEAR REVIEW: Plt Morphology: NORMAL

## 2024-09-14 LAB — FERRITIN: Ferritin: 67 ng/mL (ref 11–307)

## 2024-09-14 LAB — LACTATE DEHYDROGENASE: LDH: 151 U/L (ref 105–235)

## 2024-09-14 NOTE — Progress Notes (Signed)
 [facility]  [phone] [phone]   I connected with  Paula Houston on 09/14/24 by telephone and verified that I am speaking with the correct person using two identifiers.   I discussed the limitations of evaluation and management by telemedicine. The patient expressed understanding and agreed to proceed.   INITIAL HEMATOLOGY CONSULTATION  Referring Provider:  Elveria Quarry, NP  Reason for Referral: Leukocytosis and thrombocytosis  HPI: [name] is a 28 year old female with a past medical history significant for PCOS, depression, anxiety, migraines.  She has been referred to hematology for evaluation of leukocytosis and thrombocytosis.  Her most recent lab work was completed on 08/02/2024 which showed a WBC of 11.8, hemoglobin 13.8, MCV 93, platelets 646,000, ANC 7.5.  Review of prior lab work shows that the patient has had fairly persistent leukocytosis dating back to at least 2011.  The highest white blood cell count recorded was 27.5 on 10/01/2010.  Her white blood cell count is typically in the 11,000-18,000 range.  The white blood cell count has fluctuated up and down.  Her CBC is also notable for intermittent normocytic anemia and the platelet count has been fairly persistently elevated since at least April 2010.  The patient reports that her energy level has been decreased.  She noticed that her energy level was better while taking the topiramate  and phentermine  for weight loss but she recently stopped this and notices that her energy level has declined since then.  She reports that she sleeps well and does not have a history of snoring.  She reports a good appetite and no unintentional weight loss.  Overall, she feels that her weight has been stable on the weight loss medications.  She is not having any fevers, chills, night sweats.  She does note that she feels cold all the time and in particular her feet feel cold a lot.  Denies  lymphadenopathy.  She reports persistent headaches which have worsened recently and has occasional dizziness.  She reports occasional abdominal discomfort which got better after stopping her weight loss medications.  She does feel as though she gets full fast.  She has occasional blood in her stool secondary to hemorrhoids.  She reports that her periods are not heavy.  Denies personal history of VTE.  She reports leg pains and no obvious joint swelling.  Denies rashes.  She is not currently taking any medications like prednisone  or other steroids.  She notes that she takes Excedrin and ibuprofen  on a daily basis for headaches.  Past Medical History:  Diagnosis Date   Anxiety    Gestational diabetes    Medical history non-contributory    Painful menstrual periods 06/04/2014   PCOS (polycystic ovarian syndrome)   :  Past Surgical History:  Procedure Laterality Date   CESAREAN SECTION N/A 12/25/2016   Procedure: CESAREAN SECTION;  Surgeon: Paula Mungo, MD;  Location: [facility];  Service: Obstetrics;  Laterality: N/A;   WISDOM TOOTH EXTRACTION    :  CURRENT MEDS: Current Outpatient Medications  Medication Sig Dispense Refill   Phentermine -Topiramate  ER 15-92 MG CP24 Take one capsule po q am for weight loss (Patient not taking: Reported on 09/14/2024) 30 capsule 2   Segesterone-Ethinyl Estradiol  (ANNOVERA ) 0.15-0.013 MG/24HR RING Insert vaginally x 3 weeks, remove for 1 week; repeat monthly x 51yr 1 each 0   No current facility-administered medications for this visit.    No Known Allergies:   Family History  Problem Relation Age of  Onset   COPD Paternal Grandmother    Diabetes Mother    Diabetes Brother   : Social History   Socioeconomic History   Marital status: Married    Spouse name: Toribio   Number of children: 3   Years of education: Not on file   Highest education level: Not on file  Occupational History   Not on file  Tobacco Use   Smoking status:  Never   Smokeless tobacco: Never  Vaping Use   Vaping status: Never Used  Substance and Sexual Activity   Alcohol use: Yes    Comment: Occas   Drug use: No   Sexual activity: Yes    Birth control/protection: Inserts  Other Topics Concern   Not on file  Social History Narrative   Not on file   Social Drivers of Health   Financial Resource Strain: Low Risk  (12/08/2023)   Overall Financial Resource Strain (CARDIA)    Difficulty of Paying Living Expenses: Not hard at all  Food Insecurity: No Food Insecurity (12/08/2023)   Hunger Vital Sign    Worried About Running Out of Food in the Last Year: Never true    Ran Out of Food in the Last Year: Never true  Transportation Needs: No Transportation Needs (12/08/2023)   PRAPARE - Administrator, Civil Service (Medical): No    Lack of Transportation (Non-Medical): No  Physical Activity: Insufficiently Active (12/08/2023)   Exercise Vital Sign    Days of Exercise per Week: 3 days    Minutes of Exercise per Session: 10 min  Stress: No Stress Concern Present (12/08/2023)   [employer] of Occupational Health - Occupational Stress Questionnaire    Feeling of Stress : Not at all  Social Connections: Moderately Integrated (12/08/2023)   Social Connection and Isolation Panel    Frequency of Communication with Friends and Family: Three times a week    Frequency of Social Gatherings with Friends and Family: Once a week    Attends Religious Services: Never    Database Administrator or Organizations: Yes    Attends Engineer, Structural: More than 4 times per year    Marital Status: Married  Catering Manager Violence: Not At Risk (12/08/2023)   Humiliation, Afraid, Rape, and Kick questionnaire    Fear of Current or Ex-Partner: No    Emotionally Abused: No    Physically Abused: No    Sexually Abused: No  :  REVIEW OF SYSTEMS:  A comprehensive 14 point review of systems was negative except as noted in the HPI.     Exam: There were no vitals taken for this visit.   Physical Exam Vitals reviewed.  Constitutional:      General: She is not in acute distress. Pulmonary:     Comments: Speaks in full sentences Neurological:     Mental Status: She is alert and oriented to person, place, and time.  Psychiatric:        Mood and Affect: Mood normal.        Behavior: Behavior normal.        Thought Content: Thought content normal.        Judgment: Judgment normal.    LABS:  Lab Results  Component Value Date   WBC 11.8 (H) 08/02/2024   HGB 13.8 08/02/2024   HCT 41.9 08/02/2024   PLT 646 (H) 08/02/2024   GLUCOSE 89 08/02/2024   CHOL 175 08/02/2024   TRIG 154 (H) 08/02/2024   HDL  48 08/02/2024   LDLCALC 100 (H) 08/02/2024   ALT 7 08/02/2024   AST 9 08/02/2024   NA 139 08/02/2024   K 5.0 08/02/2024   CL 104 08/02/2024   CREATININE 0.63 08/02/2024   BUN 8 08/02/2024   CO2 22 08/02/2024   HGBA1C 4.8 08/02/2024    No results found.   ASSESSMENT AND PLAN:   1.  Leukocytosis.  The patient has a longstanding history of leukocytosis.  Her white blood cell count has been up and down with no obvious significant increase in the white count over time.  The leukocytosis characterized by neutrophilia.  We discussed potential causes for leukocytosis including infection, inflammation, medications, asplenia, cigarette smoking, myeloproliferative neoplasm.  She has no signs of infection or culprit medications on review.  She is not asplenia.  She is not a smoker.  There is the possibility of inflammation contributing.  Will perform additional workup today including a CBC, CMP, LDH, uric acid level, BCR/ABL and given some leg pain and arthralgias, will also perform additional workup including ESR, CRP, ANA.  Will make further recommendations pending workup.  2.  Thrombocytosis.  The patient has a longstanding history of thrombocytosis.  We discussed that potential causes can include iron deficiency,  infection, underlying rheumatologic condition/inflammation, asplenia, medications, MPN. She has no culprit medications noted upon review and no signs of infection.  She is not asplenia.  The patient has had intermittent anemia and we will check iron studies.  Will check for underlying rheumatologic disorder with ESR, CRP, ANA.  Will send for JAK2 testing.  Further recommendations pending workup.  3.  Arthralgia.  The patient is noted to have arthralgias and notes fairly significant fatigue.  Additional workup pending including ESR, CRP, ANA.  If positive, recommend rheumatology referral.  Follow-up: Lab work today and follow-up for office visit in about 4 weeks to discuss the results.  Thank you for this referral.  Jerrik Housholder, DNP, AGPCNP-BC, AOCNP

## 2024-09-15 LAB — ANA W/REFLEX IF POSITIVE: Anti Nuclear Antibody (ANA): NEGATIVE

## 2024-09-21 LAB — BCR-ABL1, CML/ALL, PCR, QUANT
E1A2 Transcript: <0.0032 %
Interpretation (BCRAL):: NEGATIVE
b2a2 transcript: <0.0032 %
b3a2 transcript: <0.0032 %

## 2024-09-28 LAB — JAK2 V617F RFX CALR/MPL/E12-15

## 2024-09-28 LAB — CALR +MPL + E12-E15  (REFLEX)

## 2024-10-08 ENCOUNTER — Encounter: Payer: Self-pay | Admitting: *Deleted

## 2024-10-23 ENCOUNTER — Inpatient Hospital Stay: Payer: Self-pay | Attending: Oncology | Admitting: Oncology

## 2024-10-23 VITALS — BP 125/91 | HR 89 | Temp 98.5°F | Resp 18 | Wt 181.0 lb

## 2024-10-23 DIAGNOSIS — R5383 Other fatigue: Secondary | ICD-10-CM

## 2024-10-23 DIAGNOSIS — D649 Anemia, unspecified: Secondary | ICD-10-CM | POA: Insufficient documentation

## 2024-10-23 DIAGNOSIS — E282 Polycystic ovarian syndrome: Secondary | ICD-10-CM | POA: Insufficient documentation

## 2024-10-23 DIAGNOSIS — D72829 Elevated white blood cell count, unspecified: Secondary | ICD-10-CM | POA: Insufficient documentation

## 2024-10-23 DIAGNOSIS — D75839 Thrombocytosis, unspecified: Secondary | ICD-10-CM | POA: Insufficient documentation

## 2024-10-23 DIAGNOSIS — M791 Myalgia, unspecified site: Secondary | ICD-10-CM

## 2024-10-23 NOTE — Progress Notes (Signed)
 "  [facility] OFFICE PROGRESS NOTE  Cook, Jayce G, DO  ASSESSMENT & PLAN:    Assessment & Plan Leukocytosis, unspecified type Patient with longstanding history of leukocytosis. It has been persistently elevated dating back to 2011. No culprit medications per chart review.  She is not a smoker.  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 Workup from 09/14/2024 showed improvement of her white count to 10.3, inflammatory markers were unremarkable, uric acid WNL.  BCR able negative. Autoimmune workup was unremarkable. Continue to monitor counts for now.  No B symptoms including fever, unintentional weight loss or appetite changes.  Thrombocytosis Patient has also had elevated platelet counts since 20 07/2010.  More recently, they have jumped up to the 600s. Most recent platelet count from 09/14/2024 was 584. Reviewed nutritional workup with iron panel which shows mildly low iron saturations and ferritin.  Otherwise, MPN workup was negative.  JAK2 testing with reflex was negative.  Inflammatory markers unremarkable.  Negative for autoimmune disorder. We discussed trying iron supplements every other day with vitamin C.  If she cannot tolerate, would recommend IV iron x 1 dose. Patient is self-pay. Recheck labs in 3 months and virtual visit.  Muscle pain ANA, CRP and ESR all WNL. Continue to monitor.   Fatigue, unspecified type Likely multifactorial. Recommend iron supplements per above.  If no improvement with oral iron, would recommend IV iron.  Orders Placed This Encounter  Procedures   Ferritin    Standing Status:   Future    Expected Date:   01/21/2025    Expiration Date:   04/21/2025   Iron and TIBC    Standing Status:   Future    Expected Date:   01/21/2025    Expiration Date:   04/21/2025   Comprehensive metabolic panel with GFR    Standing Status:   Future    Expected Date:   01/21/2025    Expiration Date:   04/21/2025   CBC with Differential/Platelet    Standing Status:    Future    Expected Date:   01/21/2025    Expiration Date:   04/21/2025    INTERVAL HISTORY: Patient returns for follow-up and to review results of her workup for thrombocytosis and leukocytosis.  Paula Houston is a 29 year old female with a past medical history significant for PCOS, depression, anxiety, migraines.  She has been referred to hematology for evaluation of leukocytosis and thrombocytosis.   Patient has had fairly consistent elevation in white blood cell count and platelets since 20 07/2010. The highest white blood cell count recorded was 27.5 on 10/01/2010.  Her white blood cell count is typically in the 11,000-18,000 range.  The white blood cell count has fluctuated up and down.  Her CBC is also notable for intermittent normocytic anemia and the platelet count has been fairly persistently elevated since at least April 2010.   Patient reports overall she is doing well.  Appetite is 100% energy levels are 75%.  She has constipation at times and nausea intermittently.  Has dizziness and headaches and trouble sleeping.  Has intermittent joint pain where she will wake up 1 day and say her legs are sore but denies working out.  Patient has 3 children so think some of her fatigue is related to this.  Reports she does not have menstrual cycles as she is on birth control for PCOS.  She rarely bleeds.  Denies any bright red blood per rectum, melena or hematochezia.  We reviewed iron levels, CBC, CMP, LDH,  uric acid, ferritin, BCR-ABL, JAK2, ESR, sedimentation rate, ANA and smear.  SUMMARY OF HEMATOLOGIC HISTORY: Oncology History   No problem history exists.     CBC    Component Value Date/Time   WBC 10.3 09/14/2024 1256   RBC 4.30 09/14/2024 1256   HGB 12.9 09/14/2024 1256   HGB 13.8 08/02/2024 1330   HCT 38.5 09/14/2024 1256   HCT 41.9 08/02/2024 1330   PLT 584 (H) 09/14/2024 1256   PLT 646 (H) 08/02/2024 1330   MCV 89.5 09/14/2024 1256   MCV 93 08/02/2024 1330   MCH 30.0  09/14/2024 1256   MCHC 33.5 09/14/2024 1256   RDW 13.1 09/14/2024 1256   RDW 12.3 08/02/2024 1330   LYMPHSABS 3.6 09/14/2024 1256   LYMPHSABS 3.1 08/02/2024 1330   MONOABS 0.8 09/14/2024 1256   EOSABS 0.2 09/14/2024 1256   EOSABS 0.2 08/02/2024 1330   BASOSABS 0.1 09/14/2024 1256   BASOSABS 0.1 08/02/2024 1330       Latest Ref Rng & Units 09/14/2024   12:56 PM 08/02/2024    1:30 PM 05/03/2021   12:04 PM  CMP  Glucose 70 - 99 mg/dL 83  89  84   BUN 6 - 20 mg/dL 8  8  7    Creatinine 0.44 - 1.00 mg/dL 9.41  9.36  9.40   Sodium 135 - 145 mmol/L 140  139  134   Potassium 3.5 - 5.1 mmol/L 3.8  5.0  4.5   Chloride 98 - 111 mmol/L 105  104  106   CO2 22 - 32 mmol/L 21  22  22    Calcium 8.9 - 10.3 mg/dL 9.2  9.6  8.6   Total Protein 6.5 - 8.1 g/dL 7.4  7.4  5.7   Total Bilirubin 0.0 - 1.2 mg/dL 0.2  <9.7  0.6   Alkaline Phos 38 - 126 U/L 66  78  123   AST 15 - 41 U/L 12  9  15    ALT 0 - 44 U/L 8  7  8       Lab Results  Component Value Date   FERRITIN 67 09/14/2024    Vitals:   10/23/24 1105 10/23/24 1110  BP: (!) 147/91 (!) 125/91  Pulse: 89   Resp: 18   Temp: 98.5 F (36.9 C)   SpO2: 99%     Review of System:  Review of Systems  Constitutional:  Positive for malaise/fatigue.  Gastrointestinal:  Positive for constipation and nausea. Negative for abdominal pain, blood in stool and diarrhea.  Musculoskeletal:  Positive for joint pain and myalgias.  Neurological:  Positive for dizziness and headaches.  Psychiatric/Behavioral:  The patient has insomnia.     Physical Exam: Physical Exam Constitutional:      Appearance: Normal appearance.  HENT:     Head: Normocephalic and atraumatic.  Eyes:     Pupils: Pupils are equal, round, and reactive to light.  Cardiovascular:     Rate and Rhythm: Normal rate and regular rhythm.     Heart sounds: Normal heart sounds. No murmur heard. Pulmonary:     Effort: Pulmonary effort is normal.     Breath sounds: Normal breath  sounds. No wheezing.  Abdominal:     General: Bowel sounds are normal. There is no distension.     Palpations: Abdomen is soft.     Tenderness: There is no abdominal tenderness.  Musculoskeletal:        General: Normal range of motion.  Cervical back: Normal range of motion.  Skin:    General: Skin is warm and dry.     Findings: No rash.  Neurological:     Mental Status: She is alert and oriented to person, place, and time.     Gait: Gait is intact.  Psychiatric:        Mood and Affect: Mood and affect normal.        Cognition and Memory: Memory normal.        Judgment: Judgment normal.      I spent 25 minutes dedicated to the care of this patient (face-to-face and non-face-to-face) on the date of the encounter to include what is described in the assessment and plan.,  Delon Hope, NP 10/23/2024 12:59 PM "

## 2024-10-23 NOTE — Assessment & Plan Note (Addendum)
 Patient has also had elevated platelet counts since 20 07/2010.  More recently, they have jumped up to the 600s. Most recent platelet count from 09/14/2024 was 584. Reviewed nutritional workup with iron panel which shows mildly low iron saturations and ferritin.  Otherwise, MPN workup was negative.  JAK2 testing with reflex was negative.  Inflammatory markers unremarkable.  Negative for autoimmune disorder. We discussed trying iron supplements every other day with vitamin C.  If she cannot tolerate, would recommend IV iron x 1 dose. Patient is self-pay. Recheck labs in 3 months and virtual visit.

## 2024-10-23 NOTE — Assessment & Plan Note (Addendum)
 ANA, CRP and ESR all WNL. Continue to monitor.

## 2024-10-23 NOTE — Assessment & Plan Note (Addendum)
 Patient with longstanding history of leukocytosis. It has been persistently elevated dating back to 2011. No culprit medications per chart review.  She is not a smoker.  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 Workup from 09/14/2024 showed improvement of her white count to 10.3, inflammatory markers were unremarkable, uric acid WNL.  BCR able negative. Autoimmune workup was unremarkable. Continue to monitor counts for now.  No B symptoms including fever, unintentional weight loss or appetite changes.

## 2024-10-23 NOTE — Patient Instructions (Signed)
 Ferrous gluconate every other day.   Slow Fe on [employer].   Take Vitamin C 250 mg every other day.   RTC in 3 months.

## 2024-11-01 ENCOUNTER — Encounter: Payer: Self-pay | Admitting: Nurse Practitioner

## 2024-11-01 NOTE — Telephone Encounter (Signed)
 Nurses-Carolyn will be back in by next week I think it would be best for Aleck to address this issue because he the other providers are not familiar with this situation  If the patient is having any critical issues we can address this otherwise I would recommend waiting till Elveria comes back-occasionally she does check her messages beforehand as well thank you

## 2024-11-08 ENCOUNTER — Other Ambulatory Visit: Payer: Self-pay | Admitting: Nurse Practitioner

## 2024-11-08 MED ORDER — WEGOVY 1.5 MG PO TABS: 1.5000 mg | ORAL_TABLET | Freq: Every day | ORAL | 0 refills | Status: AC

## 2025-01-29 ENCOUNTER — Inpatient Hospital Stay: Payer: Self-pay

## 2025-02-05 ENCOUNTER — Inpatient Hospital Stay: Payer: Self-pay | Admitting: Oncology
